# Patient Record
Sex: Male | Born: 1960
Health system: Southern US, Community
[De-identification: ages and names within clinical notes are randomized; demographics above are authoritative.]

## PROBLEM LIST (undated history)

## (undated) DIAGNOSIS — E291 Testicular hypofunction: Secondary | ICD-10-CM

## (undated) DIAGNOSIS — K219 Gastro-esophageal reflux disease without esophagitis: Secondary | ICD-10-CM

## (undated) DIAGNOSIS — F4321 Adjustment disorder with depressed mood: Secondary | ICD-10-CM

## (undated) DIAGNOSIS — Z87442 Personal history of urinary calculi: Secondary | ICD-10-CM

## (undated) DIAGNOSIS — M722 Plantar fascial fibromatosis: Secondary | ICD-10-CM

## (undated) DIAGNOSIS — N200 Calculus of kidney: Secondary | ICD-10-CM

## (undated) DIAGNOSIS — K4090 Unilateral inguinal hernia, without obstruction or gangrene, not specified as recurrent: Secondary | ICD-10-CM

## (undated) DIAGNOSIS — Z8619 Personal history of other infectious and parasitic diseases: Secondary | ICD-10-CM

## (undated) DIAGNOSIS — F32A Depression, unspecified: Secondary | ICD-10-CM

## (undated) DIAGNOSIS — N400 Enlarged prostate without lower urinary tract symptoms: Secondary | ICD-10-CM

## (undated) DIAGNOSIS — I10 Essential (primary) hypertension: Secondary | ICD-10-CM

## (undated) DIAGNOSIS — R3129 Other microscopic hematuria: Secondary | ICD-10-CM

## (undated) DIAGNOSIS — F5104 Psychophysiologic insomnia: Secondary | ICD-10-CM

## (undated) DIAGNOSIS — N529 Male erectile dysfunction, unspecified: Secondary | ICD-10-CM

## (undated) HISTORY — DX: Gastro-esophageal reflux disease without esophagitis: K21.9

## (undated) HISTORY — DX: Other microscopic hematuria: R31.29

## (undated) HISTORY — DX: Benign prostatic hyperplasia without lower urinary tract symptoms: N40.0

## (undated) HISTORY — DX: Unilateral inguinal hernia, without obstruction or gangrene, not specified as recurrent: K40.90

## (undated) HISTORY — DX: Calculus of kidney: N20.0

## (undated) HISTORY — PX: LITHOTRIPSY: SUR834

## (undated) HISTORY — DX: Testicular hypofunction: E29.1

## (undated) HISTORY — DX: Male erectile dysfunction, unspecified: N52.9

## (undated) NOTE — *Deleted (*Deleted)
12/19/2019 8:31 AM   Allen Chapman December 12, 1960 [id number]  Referring provider: [facility] [address] [place],  [place] [address] No chief complaint on file.   HPI: Allen Chapman is a 73 y.o. male who returns for a 1 month follow up of BPH and chronic prostatitis.   During his initial visit in 11/2018, the patient reported a long history of obstructive urinary symptoms including weak stream, hesitancy and nocturia.  He tried multiple medications in the past including finasteride which significantly impaired his libido.  He tried Flomax on several occasions for which he had dizziness and low blood pressure.  It appeared at some point he was prescribed doxazosin but the patient cannot recall whether or not this was tolerated.  He does have a personal history of bilateral pulmonary emboli diagnosed in 08/2018.  He was started on anticoagulation and had been referred to hematology for further evaluation of this. He was recommended to undergo indefinite anticoagulation on Xarelto who subsequently discontinued the medication.   Patient was seen in the ED on 11/08/2019 for left groin pain. Low back pain radiated to his groin. Pain was worse with walking. He had burning with urination. He was given Percocet for pain prn. Scrotum ultrasound on 11/09/2019 noted bilateral hydroceles and varicoceles. No palpable abnormality is noted within the testicles. Symmetrical tubular ectasia. CT renal stone study was negative.   Patient followed up with Carman Ching, PA-C on 11/14/2019 for prostatitis. He had lower abdominal and groin pain. Nocturia improved from 15 to 4 nightly. He also reported some bladder burning after termination of urination. He reported the pain was exacerbated with walking, during which times he felt that his left testicle swells.  He was taking alfuzosin daily but did not wish to continue it long-term because he felt it was decreasing his sex drive and he was  concerned about long-term sexual side effects.  He was taking Bactrim as prescribed.  He stopped meloxicam 2 days prior because he felt it was not helping his symptoms.  PVR 11 mL.   Patient was frustrated by his ongoing symptoms and curious as to why his prostate was inflamed, and wanted treatment to resolve this issue. Patient was given Myrbetriq 25 mg samples.  Patient reports that Myrbetriq 25 mg did/not improve his urinary symptoms. His nocturia is stable/better ***  His left testicle is ****  PMH: Past Medical History:  Diagnosis Date  . Erectile dysfunction 07/02/2014  . GERD (gastroesophageal reflux disease)   . Hematuria, microscopic 07/02/2014  . Hypogonadism in male 07/02/2014  . Inguinal hernia, left 07/02/2014  . Kidney stone   . Prostate enlargement     Surgical History: Past Surgical History:  Procedure Laterality Date  . APPENDECTOMY  1980  . CYSTOSCOPY  03/31/2014  . EXTRACORPOREAL SHOCK WAVE LITHOTRIPSY Left 10/10/2015   Procedure: EXTRACORPOREAL SHOCK WAVE LITHOTRIPSY (ESWL);  Surgeon: Hildred Laser, MD;  Location: [facility] ORS;  Service: Urology;  Laterality: Left;  . LITHOTRIPSY      Home Medications:  Allergies as of 12/19/2019      Reactions   Nortriptyline Shortness Of Breath   Flomax [tamsulosin Hcl] Other (See Comments)   Dizziness      Medication List       Accurate as of December 18, 2019  8:31 AM. If you have any questions, ask your nurse or doctor.        alfuzosin 10 MG 24 hr tablet Commonly known as: UROXATRAL Take 1 tablet (10 mg  total) by mouth daily with breakfast.   ciprofloxacin 500 MG tablet Commonly known as: CIPRO Take 500 mg by mouth 2 (two) times daily.   meloxicam 15 MG tablet Commonly known as: MOBIC Take 1 tablet (15 mg total) by mouth daily.   methylPREDNISolone 4 MG Tbpk tablet Commonly known as: MEDROL DOSEPAK 6 day dose pack - take as directed   mirabegron ER 25 MG Tb24 tablet Commonly known as: MYRBETRIQ  Take 1 tablet (25 mg total) by mouth daily.   naproxen 500 MG tablet Commonly known as: NAPROSYN Take 500 mg by mouth 2 (two) times daily.   omeprazole 40 MG capsule Commonly known as: PRILOSEC Take 40 mg by mouth daily.   oxyCODONE-acetaminophen 5-325 MG tablet Commonly known as: Percocet Take 1 tablet by mouth every 4 (four) hours as needed for severe pain.   predniSONE 10 MG tablet Commonly known as: DELTASONE Take 10 mg by mouth 2 (two) times daily.   sucralfate 1 g tablet Commonly known as: CARAFATE Take by mouth daily.   temazepam 15 MG capsule Commonly known as: RESTORIL Take 15 mg by mouth at bedtime.   traMADol 50 MG tablet Commonly known as: ULTRAM Take 50 mg by mouth every 8 (eight) hours as needed.   Xarelto 20 MG Tabs tablet Generic drug: rivaroxaban       Allergies:  Allergies  Allergen Reactions  . Nortriptyline Shortness Of Breath  . Flomax [Tamsulosin Hcl] Other (See Comments)    Dizziness    Family History: Family History  Problem Relation Age of Onset  . Bladder Cancer Neg Hx   . Kidney cancer Neg Hx   . Prostate cancer Neg Hx     Social History:  reports that he has never smoked. He has never used smokeless tobacco. He reports that he does not drink alcohol and does not use drugs.   Physical Exam: There were no vitals taken for this visit.  Constitutional:  Alert and oriented, No acute distress. HEENT: [place] AT, moist mucus membranes.  Trachea midline, no masses. Cardiovascular: No clubbing, cyanosis, or edema. Respiratory: Normal respiratory effort, no increased work of breathing. GI: Abdomen is soft, nontender, nondistended, no abdominal masses GU: No CVA tenderness Lymph: No cervical or inguinal lymphadenopathy. Skin: No rashes, bruises or suspicious lesions. Neurologic: Grossly intact, no focal deficits, moving all 4 extremities. Psychiatric: Normal mood and affect.  Laboratory Data:  Lab Results  Component Value Date    CREATININE 0.74 11/08/2019    No results found for: PSA  No results found for: TESTOSTERONE  No results found for: HGBA1C  Urinalysis   Pertinent Imaging: *** Results for orders placed during the hospital encounter of 11/22/15  Abdomen 1 view (KUB)  Narrative CLINICAL DATA:  Left UPJ calculus.  Pre lithotripsy exam.  EXAM: ABDOMEN - 1 VIEW  COMPARISON:  10/10/2015 and prior radiographs. 10/04/2015 and prior CT  FINDINGS: Bowel gas/stool obscures detail.  The left UPJ calculus identified on prior exams is difficult to visualize with certainty on this study.  No other suspicious calcifications are noted overlying the renal shadows or expected course of the ureters.  The bowel gas pattern is unremarkable.  No acute bony abnormalities identified.  IMPRESSION: Bowel gas/stool obscures detail within the previously identified left UPJ is not de fully visualize with certainty on this study. No other suspicious calcifications overlying the renal shadows or the expected courses of the ureters.   Electronically Signed By: Harmon Pier M.D. On: 11/22/2015 09:59  Results for orders placed during the hospital encounter of 05/23/17  US Venous Img Lower Bilateral  Narrative CLINICAL DATA:  Bilateral leg pain  EXAM: BILATERAL LOWER EXTREMITY VENOUS DOPPLER ULTRASOUND  TECHNIQUE: Gray-scale sonography with graded compression, as well as color Doppler and duplex ultrasound were performed to evaluate the lower extremity deep venous systems from the level of the common femoral vein and including the common femoral, femoral, profunda femoral, popliteal and calf veins including the posterior tibial, peroneal and gastrocnemius veins when visible. The superficial great saphenous vein was also interrogated. Spectral Doppler was utilized to evaluate flow at rest and with distal augmentation maneuvers in the common femoral, femoral and popliteal veins.  COMPARISON:  None.   FINDINGS: RIGHT LOWER EXTREMITY  Common Femoral Vein: No evidence of thrombus. Normal compressibility, respiratory phasicity and response to augmentation.  Saphenofemoral Junction: No evidence of thrombus. Normal compressibility and flow on color Doppler imaging.  Profunda Femoral Vein: No evidence of thrombus. Normal compressibility and flow on color Doppler imaging.  Femoral Vein: No evidence of thrombus. Normal compressibility, respiratory phasicity and response to augmentation.  Popliteal Vein: No evidence of thrombus. Normal compressibility, respiratory phasicity and response to augmentation.  Calf Veins: No evidence of thrombus. Normal compressibility and flow on color Doppler imaging.  Superficial Great Saphenous Vein: No evidence of thrombus. Normal compressibility.  Venous Reflux:  None.  Other Findings:  None.  LEFT LOWER EXTREMITY  Common Femoral Vein: No evidence of thrombus. Normal compressibility, respiratory phasicity and response to augmentation.  Saphenofemoral Junction: No evidence of thrombus. Normal compressibility and flow on color Doppler imaging.  Profunda Femoral Vein: No evidence of thrombus. Normal compressibility and flow on color Doppler imaging.  Femoral Vein: No evidence of thrombus. Normal compressibility, respiratory phasicity and response to augmentation.  Popliteal Vein: No evidence of thrombus. Normal compressibility, respiratory phasicity and response to augmentation.  Calf Veins: No evidence of thrombus. Normal compressibility and flow on color Doppler imaging.  Superficial Great Saphenous Vein: No evidence of thrombus. Normal compressibility.  Venous Reflux:  None.  Other Findings:  None.  IMPRESSION: No evidence of deep venous thrombosis bilaterally.   Electronically Signed By: Alcide Clever M.D. On: 05/23/2017 16:08  No results found for this or any previous visit.  No results found for this or any previous  visit.  No results found for this or any previous visit.  No results found for this or any previous visit.  No results found for this or any previous visit.  Results for orders placed during the hospital encounter of 11/08/19  CT Renal Stone Study  Narrative CLINICAL DATA:  Flank pain, dysuria, left groin pain  EXAM: CT ABDOMEN AND PELVIS WITHOUT CONTRAST  TECHNIQUE: Multidetector CT imaging of the abdomen and pelvis was performed following the standard protocol without IV contrast.  COMPARISON:  10/07/2018  FINDINGS: Lower chest: The visualized lung bases are clear. The visualized heart and pericardium are unremarkable.  Hepatobiliary: No focal liver abnormality is seen. No gallstones, gallbladder wall thickening, or biliary dilatation.  Pancreas: Are unremarkable  Spleen: Unremarkable  Adrenals/Urinary Tract: The adrenal glands are unremarkable. The kidneys are normal in size and position. Multiple bilateral simple cortical cysts are identified measuring up to 8.1 cm within the lower pole of the right kidney and 4.2 cm with within the lower pole of the left kidney. Hyperdense renal cyst arising from the lower pole of the right kidney is decreased in size since remote prior examination of 08/28/2013, safely considered benign.  No intrarenal or ureteral calculi. No hydronephrosis. The bladder is largely decompressed and is unremarkable.  Stomach/Bowel: The stomach, small bowel, and large bowel are unremarkable. Appendix normal. No free intraperitoneal gas or fluid.  Vascular/Lymphatic: The abdominal vasculature is unremarkable on this noncontrast examination. No pathologic adenopathy within the abdomen and pelvis.  Reproductive: Prostate is unremarkable.  Other: Rectum unremarkable  Musculoskeletal: Bone island within the left greater trochanter. Degenerative changes are seen within the lumbosacral junction. No lytic or blastic bone lesions are seen.   IMPRESSION: No radiographic explanation for the patient's symptoms. Incidental findings as noted above.   Electronically Signed By: Helyn Numbers MD On: 11/09/2019 02:34   Assessment & Plan:     No follow-ups on file.  [facility] [address] [place], [place] [address] [phone]  I, Theador Hawthorne, am acting as a scribe for Dr. Vanna Scotland.  {Add Production assistant, radio Statement}

---
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
## 2008-12-06 ENCOUNTER — Emergency Department: Payer: Self-pay | Admitting: Emergency Medicine

## 2009-03-31 ENCOUNTER — Emergency Department: Payer: Self-pay | Admitting: Emergency Medicine

## 2010-10-02 ENCOUNTER — Ambulatory Visit: Payer: Self-pay | Admitting: Family Medicine

## 2011-01-23 ENCOUNTER — Ambulatory Visit: Payer: Self-pay | Admitting: Rheumatology

## 2012-01-10 ENCOUNTER — Ambulatory Visit: Payer: Self-pay | Admitting: Internal Medicine

## 2012-01-14 ENCOUNTER — Emergency Department: Payer: Self-pay | Admitting: Emergency Medicine

## 2012-01-14 LAB — BASIC METABOLIC PANEL
Anion Gap: 5 — ABNORMAL LOW (ref 7–16)
BUN: 18 mg/dL (ref 7–18)
Calcium, Total: 9.2 mg/dL (ref 8.5–10.1)
Chloride: 105 mmol/L (ref 98–107)
Co2: 30 mmol/L (ref 21–32)
Creatinine: 0.77 mg/dL (ref 0.60–1.30)
EGFR (African American): >60
EGFR (Non-African Amer.): >60
Glucose: 95 mg/dL (ref 65–99)
Osmolality: 281 (ref 275–301)
Potassium: 4 mmol/L (ref 3.5–5.1)
Sodium: 140 mmol/L (ref 136–145)

## 2012-01-14 LAB — URINALYSIS, COMPLETE
Bilirubin,UR: NEGATIVE
Blood: NEGATIVE
Glucose,UR: NEGATIVE mg/dL (ref 0–75)
Ketone: NEGATIVE
Leukocyte Esterase: NEGATIVE
Nitrite: NEGATIVE
Ph: 6 (ref 4.5–8.0)
Protein: NEGATIVE
RBC,UR: NONE SEEN /HPF (ref 0–5)
Specific Gravity: 1.018 (ref 1.003–1.030)
Squamous Epithelial: <1
WBC UR: 1 /HPF (ref 0–5)

## 2012-01-14 LAB — CBC
HCT: 44.6 % (ref 40.0–52.0)
HGB: 15.6 g/dL (ref 13.0–18.0)
MCH: 28.9 pg (ref 26.0–34.0)
MCHC: 35 g/dL (ref 32.0–36.0)
MCV: 82 fL (ref 80–100)
Platelet: 237 10*3/uL (ref 150–440)
RBC: 5.41 10*6/uL (ref 4.40–5.90)
RDW: 14 % (ref 11.5–14.5)
WBC: 6.8 10*3/uL (ref 3.8–10.6)

## 2012-01-15 LAB — URINE CULTURE

## 2012-07-01 DIAGNOSIS — R350 Frequency of micturition: Secondary | ICD-10-CM | POA: Insufficient documentation

## 2012-07-01 DIAGNOSIS — R3916 Straining to void: Secondary | ICD-10-CM | POA: Insufficient documentation

## 2012-07-01 DIAGNOSIS — R3915 Urgency of urination: Secondary | ICD-10-CM | POA: Insufficient documentation

## 2012-07-01 DIAGNOSIS — R351 Nocturia: Secondary | ICD-10-CM | POA: Insufficient documentation

## 2013-03-22 ENCOUNTER — Ambulatory Visit: Payer: Self-pay | Admitting: Podiatry

## 2013-04-11 ENCOUNTER — Ambulatory Visit (INDEPENDENT_AMBULATORY_CARE_PROVIDER_SITE_OTHER): Payer: BC Managed Care – PPO

## 2013-04-11 ENCOUNTER — Ambulatory Visit: Payer: Self-pay | Admitting: Podiatry

## 2013-04-11 ENCOUNTER — Encounter: Payer: Self-pay | Admitting: Podiatry

## 2013-04-11 ENCOUNTER — Ambulatory Visit (INDEPENDENT_AMBULATORY_CARE_PROVIDER_SITE_OTHER): Payer: BC Managed Care – PPO | Admitting: Podiatry

## 2013-04-11 VITALS — BP 139/94 | HR 92 | Resp 16 | Ht 66.0 in | Wt 172.0 lb

## 2013-04-11 DIAGNOSIS — M722 Plantar fascial fibromatosis: Secondary | ICD-10-CM

## 2013-04-11 MED ORDER — DICLOFENAC SODIUM 75 MG PO TBEC: 75.0000 mg | DELAYED_RELEASE_TABLET | Freq: Two times a day (BID) | ORAL | Status: DC

## 2013-04-11 MED ORDER — TRIAMCINOLONE ACETONIDE 10 MG/ML IJ SUSP
10.0000 mg | Freq: Once | INTRAMUSCULAR | Status: AC
  Administered 2013-04-11: 10 mg

## 2013-04-11 NOTE — Progress Notes (Signed)
Subjective:     Patient ID: Allen Chapman, male   DOB: 12/31/1960, 53 y.o.   MRN: [id number]  Foot Pain   patient presents stating I'm having a lot of pain in my left heel that it's been present for about one year. I've had several injections which helped me temporarily but no other treatment besides medicine by mouth which only helped a little bit   Review of Systems  All other systems reviewed and are negative.       Objective:   Physical Exam  Nursing note and vitals reviewed. Constitutional: He is oriented to person, place, and time.  Cardiovascular: Intact distal pulses.   Musculoskeletal: Normal range of motion.  Neurological: He is oriented to person, place, and time.  Skin: Skin is warm.   neurovascular status intact with muscle strength adequate and no equinus condition noted and range of motion within normal limits. Discomfort noted in the left plantar heel and also in the left lateral foot around the peroneal tendon complex as it inserts into the base of the fifth metatarsal bone    Assessment:     Plantar fasciitis of a significant nature left with also tendinitis lateral that is compensation in nature    Plan:     H&P and x-ray reviewed. Injected the plantar fascia 3 mg Kenalog 5 mg Xylocaine Marcaine mixture and the left lateral foot 3 mg Kenalog 5 mg Xylocaine Marcaine mixture and dispensed night splint and fascial brace to wear. Begin voltaren and 75 mg twice a day and reappoint in 2 weeks

## 2013-04-11 NOTE — Patient Instructions (Signed)

## 2013-04-11 NOTE — Progress Notes (Signed)
   Subjective:    Patient ID: Allen Chapman, male    DOB: 08/08/1960, 53 y.o.   MRN: [id number]  HPI Comments: Been complaining a lot about heel pain in the left foot, feels like a bruise, especially when walking , its been ober a year . Taken etodolac for it. Seen dr Ether Griffinsfowler [facility] at [facility] , he had two injections   Foot Pain      Review of Systems  All other systems reviewed and are negative.       Objective:   Physical Exam        Assessment & Plan:

## 2013-04-25 ENCOUNTER — Ambulatory Visit: Payer: BC Managed Care – PPO | Admitting: Podiatry

## 2013-05-02 ENCOUNTER — Ambulatory Visit (INDEPENDENT_AMBULATORY_CARE_PROVIDER_SITE_OTHER): Payer: BC Managed Care – PPO | Admitting: Podiatry

## 2013-05-02 VITALS — BP 132/90 | HR 91 | Resp 16 | Ht 66.0 in | Wt 169.0 lb

## 2013-05-02 DIAGNOSIS — M722 Plantar fascial fibromatosis: Secondary | ICD-10-CM

## 2013-05-02 MED ORDER — TRIAMCINOLONE ACETONIDE 10 MG/ML IJ SUSP
10.0000 mg | Freq: Once | INTRAMUSCULAR | Status: AC
  Administered 2013-05-02: 10 mg

## 2013-05-02 MED ORDER — TRAMADOL HCL 50 MG PO TABS: 50.0000 mg | ORAL_TABLET | Freq: Three times a day (TID) | ORAL | Status: DC

## 2013-05-04 NOTE — Progress Notes (Signed)
Subjective:     Patient ID: Carmie KannerRafael Bord, male   DOB: 09/06/1960, 53 y.o.   MRN: [id number]  HPI patient presents with interpreter stating my left heel is still extremely sore and I did not do better with the medication that U. gave me at last visit. Patient presents with interpreter and states that he has to walk on cement floor   Review of Systems     Objective:   Physical Exam Neurovascular status intact with intense discomfort left plantar heel at the insertion of the tendon into the calcaneus with inflammation and fluid noted around the medial band    Assessment:     Plantar fasciitis with acute inflammation left heel that did not respond well to conservative care to this time    Plan:     Reviewed condition with patient and interpreter. I have recommended aggressive conservative approach with possibility that this will require surgery. Today I did reinject the plantar fascia 3 mg Kenalog 5 mg Xylocaine Marcaine mixture placed on tramadol 50 mg 3 times a day dispensed air fracture walker to completely immobilize the plantar heel and scanned for custom orthotics to reduce stress against the arch and heel. If we cannot achieve success this will require a more aggressive treatment options

## 2013-05-17 ENCOUNTER — Encounter: Payer: Self-pay | Admitting: *Deleted

## 2013-05-17 NOTE — Progress Notes (Signed)
Sent pt post card letting him know orthotics are here. 

## 2013-05-23 ENCOUNTER — Ambulatory Visit: Payer: BC Managed Care – PPO | Admitting: Podiatry

## 2013-06-13 ENCOUNTER — Ambulatory Visit (INDEPENDENT_AMBULATORY_CARE_PROVIDER_SITE_OTHER): Payer: BC Managed Care – PPO | Admitting: Podiatry

## 2013-06-13 VITALS — Resp 16 | Ht 66.0 in | Wt 169.0 lb

## 2013-06-13 DIAGNOSIS — M722 Plantar fascial fibromatosis: Secondary | ICD-10-CM

## 2013-06-13 NOTE — Progress Notes (Signed)
Subjective:     Patient ID: Allen KannerRafael Rabanal, male   DOB: 03/22/1960, 53 y.o.   MRN: [id number]  HPI patient presents with daughter stating my heel and arch are still killing me and the injections are not helping   Review of Systems     Objective:   Physical Exam Neurovascular status intact with continued discomfort in the left plantar heel but intense discomfort in the mid arch area left where he is probably walking differently    Assessment:     Plantar fasciitis of the mid arch area and heel left    Plan:     Orthotics dispensed with instructions and today I dispensed air fracture walker with all instructions on usage to try to take all pressure off his foot with the possibility this will require surgery long-term

## 2013-07-04 ENCOUNTER — Ambulatory Visit: Payer: BC Managed Care – PPO | Admitting: Podiatry

## 2013-07-30 ENCOUNTER — Emergency Department: Payer: Self-pay | Admitting: Emergency Medicine

## 2013-07-30 LAB — CBC WITH DIFFERENTIAL/PLATELET
Basophil #: 0 10*3/uL (ref 0.0–0.1)
Basophil %: 0.5 %
Eosinophil #: 0.2 10*3/uL (ref 0.0–0.7)
Eosinophil %: 2.5 %
HCT: 44.7 % (ref 40.0–52.0)
HGB: 15.2 g/dL (ref 13.0–18.0)
Lymphocyte #: 3.3 10*3/uL (ref 1.0–3.6)
Lymphocyte %: 40.2 %
MCH: 28.6 pg (ref 26.0–34.0)
MCHC: 34 g/dL (ref 32.0–36.0)
MCV: 84 fL (ref 80–100)
Monocyte #: 0.5 x10 3/mm (ref 0.2–1.0)
Monocyte %: 6.5 %
Neutrophil #: 4.2 10*3/uL (ref 1.4–6.5)
Neutrophil %: 50.3 %
Platelet: 225 10*3/uL (ref 150–440)
RBC: 5.32 10*6/uL (ref 4.40–5.90)
RDW: 13.8 % (ref 11.5–14.5)
WBC: 8.3 10*3/uL (ref 3.8–10.6)

## 2013-07-30 LAB — BASIC METABOLIC PANEL
Anion Gap: 6 — ABNORMAL LOW (ref 7–16)
BUN: 17 mg/dL (ref 7–18)
Calcium, Total: 8.2 mg/dL — ABNORMAL LOW (ref 8.5–10.1)
Chloride: 106 mmol/L (ref 98–107)
Co2: 29 mmol/L (ref 21–32)
Creatinine: 0.92 mg/dL (ref 0.60–1.30)
EGFR (African American): >60
EGFR (Non-African Amer.): >60
Glucose: 92 mg/dL (ref 65–99)
Osmolality: 282 (ref 275–301)
Potassium: 3.1 mmol/L — ABNORMAL LOW (ref 3.5–5.1)
Sodium: 141 mmol/L (ref 136–145)

## 2013-08-25 ENCOUNTER — Emergency Department: Payer: Self-pay | Admitting: Emergency Medicine

## 2013-08-25 LAB — COMPREHENSIVE METABOLIC PANEL
Albumin: 3.3 g/dL — ABNORMAL LOW (ref 3.4–5.0)
Alkaline Phosphatase: 62 U/L
Anion Gap: 7 (ref 7–16)
BUN: 21 mg/dL — ABNORMAL HIGH (ref 7–18)
Bilirubin,Total: 0.6 mg/dL (ref 0.2–1.0)
Calcium, Total: 8.6 mg/dL (ref 8.5–10.1)
Chloride: 110 mmol/L — ABNORMAL HIGH (ref 98–107)
Co2: 25 mmol/L (ref 21–32)
Creatinine: 0.79 mg/dL (ref 0.60–1.30)
EGFR (African American): >60
EGFR (Non-African Amer.): >60
Glucose: 131 mg/dL — ABNORMAL HIGH (ref 65–99)
Osmolality: 288 (ref 275–301)
Potassium: 3.8 mmol/L (ref 3.5–5.1)
SGOT(AST): 24 U/L (ref 15–37)
SGPT (ALT): 33 U/L (ref 12–78)
Sodium: 142 mmol/L (ref 136–145)
Total Protein: 7 g/dL (ref 6.4–8.2)

## 2013-08-25 LAB — TROPONIN I: Troponin-I: <0.02 ng/mL

## 2013-08-25 LAB — URINALYSIS, COMPLETE
Bacteria: NONE SEEN
Bilirubin,UR: NEGATIVE
Blood: NEGATIVE
Glucose,UR: NEGATIVE mg/dL (ref 0–75)
Ketone: NEGATIVE
Leukocyte Esterase: NEGATIVE
Nitrite: NEGATIVE
Ph: 5 (ref 4.5–8.0)
Protein: NEGATIVE
RBC,UR: <1 /HPF (ref 0–5)
Specific Gravity: 1.027 (ref 1.003–1.030)
Squamous Epithelial: NONE SEEN
WBC UR: 1 /HPF (ref 0–5)

## 2013-08-25 LAB — CBC WITH DIFFERENTIAL/PLATELET
Basophil #: 0.1 10*3/uL (ref 0.0–0.1)
Basophil %: 1.5 %
Eosinophil #: 0.1 10*3/uL (ref 0.0–0.7)
Eosinophil %: 2.9 %
HCT: 44.1 % (ref 40.0–52.0)
HGB: 14.8 g/dL (ref 13.0–18.0)
Lymphocyte #: 2 10*3/uL (ref 1.0–3.6)
Lymphocyte %: 44.1 %
MCH: 28.2 pg (ref 26.0–34.0)
MCHC: 33.5 g/dL (ref 32.0–36.0)
MCV: 84 fL (ref 80–100)
Monocyte #: 0.4 x10 3/mm (ref 0.2–1.0)
Monocyte %: 9.1 %
Neutrophil #: 1.9 10*3/uL (ref 1.4–6.5)
Neutrophil %: 42.4 %
Platelet: 228 10*3/uL (ref 150–440)
RBC: 5.24 10*6/uL (ref 4.40–5.90)
RDW: 13.6 % (ref 11.5–14.5)
WBC: 4.5 10*3/uL (ref 3.8–10.6)

## 2013-08-25 LAB — LIPASE, BLOOD: Lipase: 164 U/L (ref 73–393)

## 2013-08-28 ENCOUNTER — Emergency Department: Payer: Self-pay | Admitting: Emergency Medicine

## 2013-08-28 LAB — COMPREHENSIVE METABOLIC PANEL
Albumin: 3.2 g/dL — ABNORMAL LOW (ref 3.4–5.0)
Alkaline Phosphatase: 65 U/L
Anion Gap: 7 (ref 7–16)
BUN: 10 mg/dL (ref 7–18)
Bilirubin,Total: 0.5 mg/dL (ref 0.2–1.0)
Calcium, Total: 8.4 mg/dL — ABNORMAL LOW (ref 8.5–10.1)
Chloride: 106 mmol/L (ref 98–107)
Co2: 27 mmol/L (ref 21–32)
Creatinine: 0.88 mg/dL (ref 0.60–1.30)
EGFR (African American): >60
EGFR (Non-African Amer.): >60
Glucose: 106 mg/dL — ABNORMAL HIGH (ref 65–99)
Osmolality: 279 (ref 275–301)
Potassium: 3.7 mmol/L (ref 3.5–5.1)
SGOT(AST): 42 U/L — ABNORMAL HIGH (ref 15–37)
SGPT (ALT): 44 U/L (ref 12–78)
Sodium: 140 mmol/L (ref 136–145)
Total Protein: 7 g/dL (ref 6.4–8.2)

## 2013-08-28 LAB — CBC
HCT: 42.8 % (ref 40.0–52.0)
HGB: 14.4 g/dL (ref 13.0–18.0)
MCH: 28.4 pg (ref 26.0–34.0)
MCHC: 33.6 g/dL (ref 32.0–36.0)
MCV: 85 fL (ref 80–100)
Platelet: 221 10*3/uL (ref 150–440)
RBC: 5.06 10*6/uL (ref 4.40–5.90)
RDW: 13.3 % (ref 11.5–14.5)
WBC: 5.1 10*3/uL (ref 3.8–10.6)

## 2013-08-28 LAB — URINALYSIS, COMPLETE
Bacteria: NONE SEEN
Bilirubin,UR: NEGATIVE
Blood: NEGATIVE
Glucose,UR: NEGATIVE mg/dL (ref 0–75)
Ketone: NEGATIVE
Leukocyte Esterase: NEGATIVE
Nitrite: NEGATIVE
Ph: 8 (ref 4.5–8.0)
Protein: NEGATIVE
RBC,UR: <1 /HPF (ref 0–5)
Specific Gravity: 1.009 (ref 1.003–1.030)
Squamous Epithelial: <1
WBC UR: <1 /HPF (ref 0–5)

## 2013-08-28 LAB — LIPASE, BLOOD: Lipase: 132 U/L (ref 73–393)

## 2013-10-17 ENCOUNTER — Ambulatory Visit (INDEPENDENT_AMBULATORY_CARE_PROVIDER_SITE_OTHER): Payer: BC Managed Care – PPO | Admitting: Podiatry

## 2013-10-17 VITALS — BP 135/87 | HR 89 | Resp 16

## 2013-10-17 DIAGNOSIS — M7750 Other enthesopathy of unspecified foot: Secondary | ICD-10-CM

## 2013-10-17 DIAGNOSIS — M722 Plantar fascial fibromatosis: Secondary | ICD-10-CM

## 2013-10-17 MED ORDER — TRIAMCINOLONE ACETONIDE 10 MG/ML IJ SUSP
10.0000 mg | Freq: Once | INTRAMUSCULAR | Status: AC
  Administered 2013-10-17: 10 mg

## 2013-10-17 NOTE — Progress Notes (Signed)
Subjective:     Patient ID: Allen Chapman, male   DOB: Oct 05, 1960, 53 y.o.   MRN: [id number]  HPI patient presents with wife stating that his left plantar heel continues to give him significant problems and he is now developing a pain in the tendon in the outside of his left foot secondary to walking differently. They state he is wearing the boot when not working but it is not helping to make him better   Review of Systems     Objective:   Physical Exam Neurovascular status intact with intense discomfort plantar aspect left heel and pain in the outside of the left foot at the peroneal insertion into the base of the fifth metatarsal    Assessment:     Continued plantar fasciitis left which is failed to respond and tendinitis of the peroneal insertion secondary to gait change    Plan:     Spent a great deal of time discussing both problems and at this point do to failure to improve with numerous conservative treatments recommended that we fixed the left heel with endoscopic surgery. Spent a great deal of time going over that there is no guarantee that this will solve the problem and that actually she may have a worsening of the problem or arch pain or other problems which can occur. I did inject the left peroneal insertion today 3 mg Kenalog 5 mg Xylocaine to try to help reduce the inflammation and I then had patient and caregiver signed consent form after extensive review understanding that total recovery. Can take upwards of 6 months for this problem. Patient will schedule for endoscopic surgery left heel in about one month when his wife recovers from surgery but she is having next week

## 2013-10-25 ENCOUNTER — Telehealth: Payer: Self-pay

## 2013-10-25 NOTE — Telephone Encounter (Signed)
Attempted to call pt regarding post operative status

## 2013-11-02 ENCOUNTER — Telehealth: Payer: Self-pay | Admitting: *Deleted

## 2013-11-02 NOTE — Telephone Encounter (Signed)
Patient in a lot of pain needs something for pain

## 2013-11-03 ENCOUNTER — Other Ambulatory Visit: Payer: Self-pay | Admitting: *Deleted

## 2013-11-03 MED ORDER — HYDROCODONE-ACETAMINOPHEN 10-325 MG PO TABS: 1.0000 | ORAL_TABLET | Freq: Three times a day (TID) | ORAL | Status: DC | PRN

## 2013-11-03 NOTE — Telephone Encounter (Signed)
Ok per dr regal  Pain medication. vicodin 10/325 left message for patient to pick up at the front desk

## 2013-11-24 ENCOUNTER — Encounter: Payer: Self-pay | Admitting: *Deleted

## 2013-11-24 NOTE — Progress Notes (Signed)
Aram BeechamCynthia from surgery center called stated pt needed to cancel surgery due to his wife having surgery. Will call back later to r/s.

## 2013-12-08 ENCOUNTER — Encounter: Payer: BC Managed Care – PPO | Admitting: Podiatry

## 2013-12-20 ENCOUNTER — Ambulatory Visit: Payer: Self-pay | Admitting: Urology

## 2013-12-27 ENCOUNTER — Ambulatory Visit: Payer: BC Managed Care – PPO | Admitting: General Surgery

## 2014-01-04 ENCOUNTER — Ambulatory Visit (INDEPENDENT_AMBULATORY_CARE_PROVIDER_SITE_OTHER): Payer: BC Managed Care – PPO | Admitting: General Surgery

## 2014-01-04 ENCOUNTER — Encounter: Payer: Self-pay | Admitting: General Surgery

## 2014-01-04 VITALS — BP 110/68 | HR 78 | Resp 14 | Ht 66.0 in | Wt 168.0 lb

## 2014-01-04 DIAGNOSIS — R1032 Left lower quadrant pain: Secondary | ICD-10-CM

## 2014-01-04 DIAGNOSIS — R1030 Lower abdominal pain, unspecified: Secondary | ICD-10-CM

## 2014-01-04 NOTE — Progress Notes (Signed)
Patient ID: Allen Chapman, male   DOB: 12/01/1960, 53 y.o.   MRN: [id number]  Chief Complaint  Patient presents with  . Other    evaluation of inguinal hernia    HPI Allen Chapman is a 53 y.o. male who presents for an evaluation of a left inguinal hernia. He states he doesn't notice any abnormality in the area. He has had pain for approximately 4 months ago. No previous hernias. He uses tylenol to help relieve the pain. The pain is located between the leg and his testicle. The pain radiates down his left leg. He has some tingling in his leg as well with walking. The pain is described as an ache.   HPI  Past Medical History  Diagnosis Date  . GERD (gastroesophageal reflux disease)   . Prostate enlargement   . Kidney stone     Past Surgical History  Procedure Laterality Date  . Appendectomy  1980  . Lithotripsy      History reviewed. No pertinent family history.  Social History History  Substance Use Topics  . Smoking status: Never Smoker   . Smokeless tobacco: Never Used  . Alcohol Use: No    No Known Allergies  Current Outpatient Prescriptions  Medication Sig Dispense Refill  . omeprazole (PRILOSEC) 40 MG capsule Take 40 mg by mouth daily.    Marland Kitchen. oxyCODONE-acetaminophen (PERCOCET/ROXICET) 5-325 MG per tablet Take 1 tablet by mouth every 6 (six) hours as needed for severe pain.     No current facility-administered medications for this visit.    Review of Systems Review of Systems  Constitutional: Negative.   Respiratory: Negative.   Cardiovascular: Negative.     Blood pressure 110/68, pulse 78, resp. rate 14, height 5\' 6"  (1.676 m), weight 168 lb (76.204 kg).  Physical Exam Physical Exam  Constitutional: He is oriented to person, place, and time. He appears well-developed and well-nourished.  Cardiovascular: Normal rate and regular rhythm.   No murmur heard. Pulmonary/Chest: Effort normal and breath sounds normal.  Abdominal: Soft. Normal appearance and bowel  sounds are normal. There is no hepatosplenomegaly. There is tenderness (over the left edge of pubic bone). No hernia.  Lymphadenopathy:       Right: No inguinal adenopathy present.       Left: No inguinal adenopathy present.  Neurological: He is alert and oriented to person, place, and time.  Skin: Skin is warm and dry.    Data Reviewed CT of the abdomen/pelvis from July and most recent. No hernias seen.   Assessment    Left groin pain. No hernia noted. Patient advised.      Plan    Patient to return as needed.     Interpreter present for duration of visit. Loyda.   Falen Lehrmann G 01/05/2014, 6:39 AM

## 2014-01-04 NOTE — Patient Instructions (Signed)
Patient to return as needed. He was advised he can use Aleve or Ibuprofen for pain relief. The patient is aware to call back for any questions or concerns.

## 2014-01-05 ENCOUNTER — Encounter: Payer: Self-pay | Admitting: General Surgery

## 2014-01-08 ENCOUNTER — Ambulatory Visit: Payer: BC Managed Care – PPO | Admitting: General Surgery

## 2014-01-17 DIAGNOSIS — R197 Diarrhea, unspecified: Secondary | ICD-10-CM | POA: Insufficient documentation

## 2014-03-29 ENCOUNTER — Ambulatory Visit: Payer: Self-pay | Admitting: Family Medicine

## 2014-03-31 HISTORY — PX: CYSTOSCOPY: SUR368

## 2014-05-19 DIAGNOSIS — N419 Inflammatory disease of prostate, unspecified: Secondary | ICD-10-CM | POA: Insufficient documentation

## 2014-05-19 DIAGNOSIS — R3129 Other microscopic hematuria: Secondary | ICD-10-CM | POA: Insufficient documentation

## 2014-05-19 DIAGNOSIS — N200 Calculus of kidney: Secondary | ICD-10-CM | POA: Insufficient documentation

## 2014-05-19 DIAGNOSIS — N400 Enlarged prostate without lower urinary tract symptoms: Secondary | ICD-10-CM | POA: Insufficient documentation

## 2014-05-19 DIAGNOSIS — N529 Male erectile dysfunction, unspecified: Secondary | ICD-10-CM | POA: Insufficient documentation

## 2014-05-19 DIAGNOSIS — E291 Testicular hypofunction: Secondary | ICD-10-CM | POA: Insufficient documentation

## 2014-05-19 DIAGNOSIS — R300 Dysuria: Secondary | ICD-10-CM | POA: Insufficient documentation

## 2014-05-19 DIAGNOSIS — K4090 Unilateral inguinal hernia, without obstruction or gangrene, not specified as recurrent: Secondary | ICD-10-CM | POA: Insufficient documentation

## 2014-07-02 ENCOUNTER — Encounter: Payer: Self-pay | Admitting: Urology

## 2014-07-02 DIAGNOSIS — E291 Testicular hypofunction: Secondary | ICD-10-CM | POA: Insufficient documentation

## 2014-07-02 DIAGNOSIS — N529 Male erectile dysfunction, unspecified: Secondary | ICD-10-CM

## 2014-07-02 DIAGNOSIS — R3129 Other microscopic hematuria: Secondary | ICD-10-CM

## 2014-07-02 DIAGNOSIS — K4090 Unilateral inguinal hernia, without obstruction or gangrene, not specified as recurrent: Secondary | ICD-10-CM

## 2014-07-02 HISTORY — DX: Testicular hypofunction: E29.1

## 2014-07-02 HISTORY — DX: Male erectile dysfunction, unspecified: N52.9

## 2014-07-02 HISTORY — DX: Unilateral inguinal hernia, without obstruction or gangrene, not specified as recurrent: K40.90

## 2014-07-02 HISTORY — DX: Other microscopic hematuria: R31.29

## 2014-07-18 ENCOUNTER — Ambulatory Visit: Payer: Self-pay | Admitting: Urology

## 2014-08-06 ENCOUNTER — Encounter: Payer: Self-pay | Admitting: Urology

## 2014-08-06 ENCOUNTER — Ambulatory Visit: Payer: Self-pay | Admitting: Urology

## 2014-09-12 ENCOUNTER — Ambulatory Visit: Payer: BLUE CROSS/BLUE SHIELD | Admitting: Podiatry

## 2014-10-18 ENCOUNTER — Other Ambulatory Visit: Payer: Self-pay | Admitting: Podiatry

## 2014-10-18 DIAGNOSIS — M7672 Peroneal tendinitis, left leg: Secondary | ICD-10-CM

## 2014-10-24 ENCOUNTER — Telehealth: Payer: Self-pay | Admitting: Radiology

## 2014-10-25 ENCOUNTER — Ambulatory Visit: Admission: RE | Admit: 2014-10-25 | Payer: BLUE CROSS/BLUE SHIELD | Source: Ambulatory Visit

## 2014-10-25 ENCOUNTER — Ambulatory Visit
Admission: RE | Admit: 2014-10-25 | Discharge: 2014-10-25 | Disposition: A | Discharge status: Home / Self Care | Payer: BLUE CROSS/BLUE SHIELD | Source: Ambulatory Visit | Attending: Podiatry | Admitting: Podiatry

## 2014-10-25 ENCOUNTER — Encounter: Payer: Self-pay | Admitting: Podiatry

## 2014-10-25 DIAGNOSIS — M7672 Peroneal tendinitis, left leg: Secondary | ICD-10-CM | POA: Insufficient documentation

## 2014-10-31 DIAGNOSIS — F5104 Psychophysiologic insomnia: Secondary | ICD-10-CM | POA: Insufficient documentation

## 2014-10-31 DIAGNOSIS — IMO0001 Reserved for inherently not codable concepts without codable children: Secondary | ICD-10-CM | POA: Insufficient documentation

## 2014-10-31 DIAGNOSIS — K219 Gastro-esophageal reflux disease without esophagitis: Secondary | ICD-10-CM | POA: Insufficient documentation

## 2014-10-31 DIAGNOSIS — R030 Elevated blood-pressure reading, without diagnosis of hypertension: Secondary | ICD-10-CM

## 2015-01-28 ENCOUNTER — Ambulatory Visit: Payer: Self-pay

## 2015-01-28 ENCOUNTER — Encounter: Payer: Self-pay | Admitting: Podiatry

## 2015-01-28 ENCOUNTER — Ambulatory Visit (INDEPENDENT_AMBULATORY_CARE_PROVIDER_SITE_OTHER): Payer: BLUE CROSS/BLUE SHIELD | Admitting: Podiatry

## 2015-01-28 VITALS — BP 131/90 | HR 89 | Resp 12

## 2015-01-28 DIAGNOSIS — M722 Plantar fascial fibromatosis: Secondary | ICD-10-CM

## 2015-01-28 DIAGNOSIS — M7672 Peroneal tendinitis, left leg: Secondary | ICD-10-CM

## 2015-01-28 MED ORDER — MELOXICAM 15 MG PO TABS: 15.0000 mg | ORAL_TABLET | Freq: Every day | ORAL | Status: DC

## 2015-01-28 MED ORDER — METHYLPREDNISOLONE 4 MG PO TBPK: ORAL_TABLET | ORAL | Status: DC

## 2015-01-28 NOTE — Progress Notes (Signed)
He presents today for a chief complaint of pain to the lateral aspect of the left foot and heel 2 years. He is also complaining of right heel pain for at least a year. States that he has seen several doctors who performed injections suggested surgery and who have performed an MRI. He states that his MRI was performed by [facility] 3 months ago. He states the doctor told him that he was fine and there was nothing that could be done. He's tried pain medication but states that it does not seem to make a difference.  Objective: Vital signs stable alert and oriented 3. Pulses are strongly palpable. Neurologic sensorium is intact. Deep tendon reflexes are intact bilateral and muscle strength is 5 over 5 dorsiflexion plantar flexors and inverters everters all digits of musculature is intact. Orthopedic evaluation demonstrates pain on palpation medial calcaneal tubercle of the right heel. He also has pain on palpation medial calcaneal tubercle of the left heel but worse pain on palpation of the peroneal tendons of the left heel. Particularly as they coursed beneath the lateral malleolus extending proximally. Radius taken today demonstrate soft tissue increase in density at the plantar fascial calcaneal insertion site. I was unable to review his MRI. He will bring it next visit.  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  Plan: We discussed etiology pathology conservative versus surgical therapies. He states that he has a night splint and he also has plantar fascial braces. He also states that he has orthotics. I injected the bilateral heels today with Kenalog and local anesthetic and I placed him on a Medrol Dosepak to be followed by meloxicam. We discussed appropriate shoe gear stretching exercises ice therapy issues or modifications. I will follow-up with him in 1 month. At that time he will bring me a copy of MRI  and the radiologist report. We may need to consider an over read secondary to the chronicity of his lateral ankle pain.

## 2015-03-06 ENCOUNTER — Ambulatory Visit (INDEPENDENT_AMBULATORY_CARE_PROVIDER_SITE_OTHER): Payer: BLUE CROSS/BLUE SHIELD | Admitting: Podiatry

## 2015-03-06 ENCOUNTER — Encounter: Payer: Self-pay | Admitting: Podiatry

## 2015-03-06 VITALS — BP 151/108 | HR 85 | Resp 18

## 2015-03-06 DIAGNOSIS — M7672 Peroneal tendinitis, left leg: Secondary | ICD-10-CM | POA: Diagnosis not present

## 2015-03-06 DIAGNOSIS — M722 Plantar fascial fibromatosis: Secondary | ICD-10-CM | POA: Diagnosis not present

## 2015-03-06 MED ORDER — TRAMADOL HCL 50 MG PO TABS: 50.0000 mg | ORAL_TABLET | Freq: Three times a day (TID) | ORAL | Status: DC | PRN

## 2015-03-06 MED ORDER — NAPROXEN 500 MG PO TABS: 500.0000 mg | ORAL_TABLET | Freq: Two times a day (BID) | ORAL | Status: DC

## 2015-03-06 NOTE — Progress Notes (Signed)
He presents today for follow-up of his bilateral foot pain left greater than right suffering from plantar fasciitis and peroneal tendinitis. He presents with his Spanish interpreter. He states that his feet are still painful the shots only helped for about a week and he is at the end of his rope as to what to do about this. He brought a CD with him today for his MRI bilateral foot.  Objective: Vital signs stable alert and oriented 3. Pulses are palpable. Neurologic sensorium is intact severe pain on palpation medial calcaneal tubercle bilateral. He has moderate to severe pain along the peroneal tendons of his left foot. Less degree on the right foot. No erythema edema saline as drainage or odor.  Assessment: Chronic intractable plantar fasciitis with peroneal tendinitis as a lateral compensatory-type syndrome. However I am concerned of possible tear of the peroneal tendon incision has been over a year.  Plan: At this point I gave him tramadol and naproxen to help alleviate his symptoms. I also suggested that we request an over read of his MRI. We also sent him to physical therapy at this point should the MRI come back abnormal we may need to stop the physical therapy and consider surgical intervention immediately.

## 2015-04-03 DIAGNOSIS — G4700 Insomnia, unspecified: Secondary | ICD-10-CM | POA: Insufficient documentation

## 2015-04-03 DIAGNOSIS — R0789 Other chest pain: Secondary | ICD-10-CM | POA: Insufficient documentation

## 2015-04-03 DIAGNOSIS — K2990 Gastroduodenitis, unspecified, without bleeding: Secondary | ICD-10-CM

## 2015-04-03 DIAGNOSIS — K2970 Gastritis, unspecified, without bleeding: Secondary | ICD-10-CM | POA: Insufficient documentation

## 2015-04-03 DIAGNOSIS — R0683 Snoring: Secondary | ICD-10-CM | POA: Insufficient documentation

## 2015-04-09 ENCOUNTER — Emergency Department
Admission: EM | Admit: 2015-04-09 | Discharge: 2015-04-09 | Disposition: A | Discharge status: Home / Self Care | Payer: BLUE CROSS/BLUE SHIELD | Attending: Emergency Medicine | Admitting: Emergency Medicine

## 2015-04-09 DIAGNOSIS — Z791 Long term (current) use of non-steroidal anti-inflammatories (NSAID): Secondary | ICD-10-CM | POA: Diagnosis not present

## 2015-04-09 DIAGNOSIS — Z79899 Other long term (current) drug therapy: Secondary | ICD-10-CM | POA: Diagnosis not present

## 2015-04-09 DIAGNOSIS — J0100 Acute maxillary sinusitis, unspecified: Secondary | ICD-10-CM | POA: Diagnosis not present

## 2015-04-09 DIAGNOSIS — R509 Fever, unspecified: Secondary | ICD-10-CM | POA: Diagnosis present

## 2015-04-09 MED ORDER — IBUPROFEN 600 MG PO TABS: 600.0000 mg | ORAL_TABLET | Freq: Three times a day (TID) | ORAL | Status: DC | PRN

## 2015-04-09 MED ORDER — FEXOFENADINE-PSEUDOEPHED ER 60-120 MG PO TB12: 1.0000 | ORAL_TABLET | Freq: Two times a day (BID) | ORAL | Status: DC

## 2015-04-09 MED ORDER — KETOROLAC TROMETHAMINE 60 MG/2ML IM SOLN
60.0000 mg | Freq: Once | INTRAMUSCULAR | Status: AC
  Administered 2015-04-09: 60 mg via INTRAMUSCULAR
  Filled 2015-04-09: qty 2

## 2015-04-09 NOTE — ED Notes (Signed)
Per interpreter, pt repoorts fever, HA and congestion since Friday; st seen at [facility] and rx amoxicillin for sinus infection without relief

## 2015-04-09 NOTE — Discharge Instructions (Signed)
Sinusitis, adultos  (Sinusitis, Adult)   La sinusitis es la irritación, dolor, e hinchazón (inflamación) de las cavidades de aire en los huesos de la cara (senos paranasales). La irritación, el dolor, e hinchazón puede hacer que el aire y el moco se queden atrapados en los senos paranasales. Esto hace que los gérmenes se multipliquen y causen una infección.   CUIDADOS EN EL HOGAR   · Beba gran cantidad de líquido para mantener el pis (orina) de tono claro o amarillo pálido.  · Use un humidificador en su hogar.  · Deje correr el agua caliente de la ducha para producir vapor en el baño. Siéntese en el baño con la puerta cerrada. Inhale vapor de agua 3 a 4 veces al día.  · Ponga un paño caliente y húmedo en el rostro 3 a 4 veces al día, o según las indicaciones de su médico.  · Use aerosoles de agua salada (aerosoles de solución salina) para humedecer las secreciones nasales espesas. Esto puede ayudar al drenaje de los senos paranasales.  · Sólo tome los medicamentos que le indique el médico.  SOLICITE AYUDA DE INMEDIATO SI:   · El dolor empeora.  · Siente un dolor de cabeza intenso.  · Tiene malestar estomacal (náuseas).  · Vomita.  · Tiene mucho sueño (somnolencia).  · El rostro está inflamado (hinchado).  · Hay cambios en la visión.  · Presenta rigidez en el cuello.  · Tiene dificultad para respirar.  ASEGÚRESE DE QUE:   · Comprende estas instrucciones.  · Controlará su enfermedad.  · Solicitará ayuda de inmediato si no mejora o si empeora.     Esta información no tiene como fin reemplazar el consejo del médico. Asegúrese de hacerle al médico cualquier pregunta que tenga.     Document Released: 10/28/2011 Document Revised: 06/19/2014  Elsevier Interactive Patient Education ©2016 Elsevier Inc.

## 2015-04-09 NOTE — ED Notes (Addendum)
States he developed headache  Fever and sinus pressure and congestion since Friday was seen on Sunday at [facility] and placed on amoxil and meds for flu.Marland Kitchen

## 2015-04-09 NOTE — ED Provider Notes (Signed)
[facility] Emergency Department Provider Note  ____________________________________________  Time seen: Approximately 7:30 AM  I have reviewed the triage vital signs and the nursing notes.   HISTORY  Chief Complaint Fever and Nasal Congestion  Via interpreter  HPI Allen Chapman is a 55 y.o. male patient follow-up family urgent care clinic who was seen 2 days ago. Patient state he was diagnosed with sinus infection and given amoxicillin and another medicine for cough. Patient states continue to have a sinus congestion and body aches. States had fever. He denies any nausea vomiting diarrhea. Patient is requesting IV antibiotics for this complaint.Patient rates his pain discomfort as a 10 over 10. Patient do not know the name of the other medication he was given.   Past Medical History  Diagnosis Date  . GERD (gastroesophageal reflux disease)   . Prostate enlargement   . Kidney stone   . Erectile dysfunction 07/02/2014  . Hematuria, microscopic 07/02/2014  . Inguinal hernia, left 07/02/2014  . Hypogonadism in male 07/02/2014    Patient Active Problem List   Diagnosis Date Noted  . Erectile dysfunction 07/02/2014  . Hypogonadism in male 07/02/2014  . Difficult or painful urination 05/19/2014  . Enlarged prostate 05/19/2014  . ED (erectile dysfunction) of organic origin 05/19/2014  . 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 Hernia, inguinal 05/19/2014  . Hematuria, microscopic 05/19/2014  . Prostatitis 05/19/2014  . Calculus of kidney 05/19/2014  . Excessive urination at night 07/01/2012  . Must strain to pass urine 07/01/2012  . Urgency of micturation 07/01/2012  . FOM (frequency of micturition) 07/01/2012    Past Surgical History  Procedure Laterality Date  . Appendectomy  1980  . Lithotripsy    . Cystoscopy  03/31/2014    Current Outpatient Rx  Name  Route  Sig  Dispense  Refill  . fexofenadine-pseudoephedrine (ALLEGRA-D) 60-120 MG 12 hr tablet  Oral   Take 1 tablet by mouth 2 (two) times daily.   20 tablet   0   . ibuprofen (ADVIL,MOTRIN) 600 MG tablet   Oral   Take 1 tablet (600 mg total) by mouth every 8 (eight) hours as needed for fever, headache or mild pain.   30 tablet   0   . meloxicam (MOBIC) 15 MG tablet   Oral   Take 1 tablet (15 mg total) by mouth daily.   30 tablet   3   . methylPREDNISolone (MEDROL) 4 MG TBPK tablet      Tapering 6 day dose pack   21 tablet   0   . naproxen (NAPROSYN) 500 MG tablet   Oral   Take 1 tablet (500 mg total) by mouth 2 (two) times daily with a meal.   60 tablet   1   . omeprazole (PRILOSEC) 40 MG capsule   Oral   Take 40 mg by mouth daily.         . traMADol (ULTRAM) 50 MG tablet   Oral   Take 1 tablet (50 mg total) by mouth every 8 (eight) hours as needed.   60 tablet   1     Allergies Nortriptyline and Flomax   No family history on file.  Social History Social History  Substance Use Topics  . Smoking status: Never Smoker   . Smokeless tobacco: Never Used  . Alcohol Use: No    Review of Systems Constitutional: Subjective fever and body aches Eyes: No visual changes. ENT: No sore throat. Nasal congestion Cardiovascular: Denies chest pain. Respiratory: Denies  shortness of breath. Gastrointestinal: No abdominal pain.  No nausea, no vomiting.  No diarrhea.  No constipation. Genitourinary: Negative for dysuria. Musculoskeletal: Negative for back pain. Skin: Negative for rash. Neurological: Negative for headaches, focal weakness or numbness. Allergic/Immunilogical: The medication list 10-point ROS otherwise negative.  ____________________________________________   PHYSICAL EXAM:  VITAL SIGNS: ED Triage Vitals  Enc Vitals Group     BP 04/09/15 0120 138/97 mmHg     Pulse Rate 04/09/15 0120 93     Resp 04/09/15 0120 20     Temp 04/09/15 0120 98.9 F (37.2 C)     Temp Source 04/09/15 0120 Oral     SpO2 04/09/15 0120 94 %     Weight 04/09/15  0120 165 lb (74.844 kg)     Height 04/09/15 0120  (1.676 m)     Head Cir --      Peak Flow --      Pain Score 04/09/15 0127 10     Pain Loc --      Pain Edu? --      Excl. in GC? --     Constitutional: Alert and oriented. Well appearing and in no acute distress. Eyes: Conjunctivae are normal. PERRL. EOMI. Head: Atraumatic. Nose: Bilateral maxillary guarding in bilateral edematous nasal turbinates.  Mouth/Throat: Mucous membranes are moist.  Oropharynx non-erythematous. Neck: No stridor.  No cervical spine tenderness to palpation. Hematological/Lymphatic/Immunilogical: No cervical lymphadenopathy. Cardiovascular: Normal rate, regular rhythm. Grossly normal heart sounds.  Good peripheral circulation. Respiratory: Normal respiratory effort.  No retractions. Lungs CTAB. Gastrointestinal: Soft and nontender. No distention. No abdominal bruits. No CVA tenderness. Musculoskeletal: No lower extremity tenderness nor edema.  No joint effusions. Neurologic:  Normal speech and language. No gross focal neurologic deficits are appreciated. No gait instability. Skin:  Skin is warm, dry and intact. No rash noted. Psychiatric: Mood and affect are normal. Speech and behavior are normal.  ____________________________________________   LABS (all labs ordered are listed, but only abnormal results are displayed)  Labs Reviewed - No data to display ____________________________________________  EKG   ____________________________________________  RADIOLOGY   ____________________________________________   PROCEDURES  Procedure(s) performed: None  Critical Care performed: No  ____________________________________________   INITIAL IMPRESSION / ASSESSMENT AND PLAN / ED COURSE  Pertinent labs & imaging results that were available during my care of the patient were reviewed by me and considered in my medical decision making (see chart for details).  Maxillary sinusitis. Patient  advised continue antibodies as directed. Patient given prescription for Allegra-D and ibuprofen. Patient advised to follow-up with family doctor is no improvement. Patient given a work note for today. ____________________________________________   FINAL CLINICAL IMPRESSION(S) / ED DIAGNOSES  Final diagnoses:  Subacute maxillary sinusitis      Joni Reining, PA-C 04/09/15 1610  Richardean Canal, MD 04/09/15 [phone]

## 2015-04-29 ENCOUNTER — Ambulatory Visit: Payer: BLUE CROSS/BLUE SHIELD | Admitting: Podiatry

## 2015-05-01 ENCOUNTER — Ambulatory Visit (INDEPENDENT_AMBULATORY_CARE_PROVIDER_SITE_OTHER): Payer: BLUE CROSS/BLUE SHIELD | Admitting: Podiatry

## 2015-05-01 ENCOUNTER — Encounter: Payer: Self-pay | Admitting: Podiatry

## 2015-05-01 VITALS — BP 139/92 | HR 90 | Resp 16

## 2015-05-01 DIAGNOSIS — M722 Plantar fascial fibromatosis: Secondary | ICD-10-CM | POA: Diagnosis not present

## 2015-05-01 DIAGNOSIS — M7672 Peroneal tendinitis, left leg: Secondary | ICD-10-CM | POA: Diagnosis not present

## 2015-05-01 MED ORDER — OXYCODONE-ACETAMINOPHEN 10-325 MG PO TABS: 1.0000 | ORAL_TABLET | Freq: Three times a day (TID) | ORAL | Status: DC | PRN

## 2015-05-01 NOTE — Progress Notes (Signed)
He presents today for a over read of his MRI. He drinks his wife with him who speaks [place] and [place]. She translates for him today explaining that he is still having severe pain to his left foot. He currently works for [employer] and does a lot of walking. He states that it only gets worse it has never gotten any better. He is requesting pain medication as well as surgical intervention. His wife goes on to state that she has recently lost her job and that he will be losing his insurance as of the end of March 2017. She states that he plans to pick up insurance from [employer] however this may be a pre-existing condition.  Objective: Vital signs are stable alert and oriented 3. He still has exquisite pain without edema and without erythema overlying the lateral aspect of the foot at the peroneal tubercle. He has pain on abduction against resistance. He has pain on palpation of the central band of the plantar fascia left foot. MRI states mild tendinopathy of the peroneal tendons. And mild tendinopathy of the posterior tibial tendon as well as a mild and fasciitis.  Assessment: Peroneal tendinopathy more than likely associated with plantar fasciitis.  Plan: I started him on Percocet to help make it through the day until we can perform surgery. He was also scanned for set of orthotics. I recommended that he make sure that there is no collapse and coverage between his wife insurance and his new insurance. I will follow-up with him once his orthotics are in.

## 2015-05-10 ENCOUNTER — Ambulatory Visit: Payer: BLUE CROSS/BLUE SHIELD | Attending: Neurology

## 2015-05-10 DIAGNOSIS — R0683 Snoring: Secondary | ICD-10-CM | POA: Insufficient documentation

## 2015-07-31 ENCOUNTER — Emergency Department: Payer: 59

## 2015-07-31 ENCOUNTER — Emergency Department
Admission: EM | Admit: 2015-07-31 | Discharge: 2015-07-31 | Disposition: A | Discharge status: Home / Self Care | Payer: 59 | Attending: Emergency Medicine | Admitting: Emergency Medicine

## 2015-07-31 DIAGNOSIS — N3289 Other specified disorders of bladder: Secondary | ICD-10-CM

## 2015-07-31 DIAGNOSIS — Z79899 Other long term (current) drug therapy: Secondary | ICD-10-CM | POA: Diagnosis not present

## 2015-07-31 DIAGNOSIS — N200 Calculus of kidney: Secondary | ICD-10-CM | POA: Diagnosis not present

## 2015-07-31 DIAGNOSIS — R301 Vesical tenesmus: Secondary | ICD-10-CM | POA: Insufficient documentation

## 2015-07-31 DIAGNOSIS — R319 Hematuria, unspecified: Secondary | ICD-10-CM | POA: Diagnosis present

## 2015-07-31 LAB — URINALYSIS COMPLETE WITH MICROSCOPIC (ARMC ONLY)
Bilirubin Urine: NEGATIVE
Glucose, UA: 50 mg/dL — AB
Ketones, ur: NEGATIVE mg/dL
Leukocytes, UA: NEGATIVE
Nitrite: NEGATIVE
Protein, ur: 100 mg/dL — AB
Specific Gravity, Urine: 1.03 (ref 1.005–1.030)
Squamous Epithelial / LPF: NONE SEEN
pH: 5 (ref 5.0–8.0)

## 2015-07-31 LAB — CBC
HCT: 42.5 % (ref 40.0–52.0)
Hemoglobin: 14.9 g/dL (ref 13.0–18.0)
MCH: 28.4 pg (ref 26.0–34.0)
MCHC: 35 g/dL (ref 32.0–36.0)
MCV: 81.1 fL (ref 80.0–100.0)
Platelets: 204 10*3/uL (ref 150–440)
RBC: 5.24 MIL/uL (ref 4.40–5.90)
RDW: 13.6 % (ref 11.5–14.5)
WBC: 5.7 10*3/uL (ref 3.8–10.6)

## 2015-07-31 LAB — COMPREHENSIVE METABOLIC PANEL
ALT: 24 U/L (ref 17–63)
AST: 22 U/L (ref 15–41)
Albumin: 4 g/dL (ref 3.5–5.0)
Alkaline Phosphatase: 66 U/L (ref 38–126)
Anion gap: 8 (ref 5–15)
BUN: 19 mg/dL (ref 6–20)
CO2: 24 mmol/L (ref 22–32)
Calcium: 8.8 mg/dL — ABNORMAL LOW (ref 8.9–10.3)
Chloride: 107 mmol/L (ref 101–111)
Creatinine, Ser: 0.94 mg/dL (ref 0.61–1.24)
GFR calc Af Amer: >60 mL/min (ref >60)
GFR calc non Af Amer: >60 mL/min (ref >60)
Glucose, Bld: 93 mg/dL (ref 65–99)
Potassium: 4 mmol/L (ref 3.5–5.1)
Sodium: 139 mmol/L (ref 135–145)
Total Bilirubin: 0.7 mg/dL (ref 0.3–1.2)
Total Protein: 6.8 g/dL (ref 6.5–8.1)

## 2015-07-31 MED ORDER — SODIUM CHLORIDE 0.9 % IV BOLUS (SEPSIS)
1000.0000 mL | Freq: Once | INTRAVENOUS | Status: AC
  Administered 2015-07-31: 1000 mL via INTRAVENOUS

## 2015-07-31 MED ORDER — ONDANSETRON HCL 4 MG/2ML IJ SOLN
4.0000 mg | Freq: Once | INTRAMUSCULAR | Status: AC
  Administered 2015-07-31: 4 mg via INTRAVENOUS
  Filled 2015-07-31: qty 2

## 2015-07-31 MED ORDER — MORPHINE SULFATE (PF) 4 MG/ML IV SOLN
4.0000 mg | Freq: Once | INTRAVENOUS | Status: AC
  Administered 2015-07-31: 4 mg via INTRAVENOUS
  Filled 2015-07-31: qty 1

## 2015-07-31 MED ORDER — OXYBUTYNIN CHLORIDE 5 MG PO TABS
5.0000 mg | ORAL_TABLET | Freq: Three times a day (TID) | ORAL | Status: DC
  Administered 2015-07-31: 5 mg via ORAL
  Filled 2015-07-31: qty 1

## 2015-07-31 MED ORDER — PHENAZOPYRIDINE HCL 100 MG PO TABS
95.0000 mg | ORAL_TABLET | Freq: Once | ORAL | Status: AC
  Administered 2015-07-31: 100 mg via ORAL
  Filled 2015-07-31: qty 1

## 2015-07-31 MED ORDER — OXYBUTYNIN CHLORIDE ER 10 MG PO TB24: 10.0000 mg | ORAL_TABLET | Freq: Every day | ORAL | Status: DC

## 2015-07-31 MED ORDER — CEPHALEXIN 500 MG PO CAPS: 500.0000 mg | ORAL_CAPSULE | Freq: Four times a day (QID) | ORAL | Status: AC

## 2015-07-31 MED ORDER — OXYCODONE-ACETAMINOPHEN 5-325 MG PO TABS: 1.0000 | ORAL_TABLET | Freq: Four times a day (QID) | ORAL | Status: DC | PRN

## 2015-07-31 MED ORDER — MORPHINE SULFATE (PF) 4 MG/ML IV SOLN
4.0000 mg | Freq: Once | INTRAVENOUS | Status: AC
Start: 2015-07-31 — End: 2015-07-31
  Administered 2015-07-31: 4 mg via INTRAVENOUS
  Filled 2015-07-31: qty 1

## 2015-07-31 MED ORDER — ONDANSETRON 4 MG PO TBDP: 4.0000 mg | ORAL_TABLET | Freq: Three times a day (TID) | ORAL | Status: DC | PRN

## 2015-07-31 MED ORDER — TAMSULOSIN HCL 0.4 MG PO CAPS: 0.4000 mg | ORAL_CAPSULE | Freq: Every day | ORAL | Status: DC

## 2015-07-31 NOTE — ED Provider Notes (Signed)
[facility] Emergency Department Provider Note   ____________________________________________  Time seen: Approximately 210 AM  I have reviewed the triage vital signs and the nursing notes.   HISTORY  Chief Complaint Back Pain; Abdominal Pain; and Hematuria    HPI Allen Chapman is a 55 y.o. male who comes into the hospital today with some left flank pain and difficulty with urination. The patient reports that it was bothering him when he urinated for about a day. He reports that today he noticed that his urine was dark. He was laying in the bed tonight*having some left-sided flank pain. He reports though he went to use the bathroom and noticed some blood in his urine. He had not been feeling very well. The patient reports that this is the first time it ever happened. He reports that his pain currently as a 6 out of 10 in intensity after a dose of morphine. He reports that he does have some discomfort in his suprapubic area and burning with urination. He reports that when he urinates a little bit comes out in drops. He reports that 16 years ago he did have a kidney stone on the left side which he needed surgery for that has not had new problems with it since then. He denies any fevers, denies vomiting, denies any chest pain or shortness of breath.   Past Medical History  Diagnosis Date  . GERD (gastroesophageal reflux disease)   . Prostate enlargement   . Kidney stone   . Erectile dysfunction 07/02/2014  . Hematuria, microscopic 07/02/2014  . Inguinal hernia, left 07/02/2014  . Hypogonadism in male 07/02/2014    Patient Active Problem List   Diagnosis Date Noted  . Erectile dysfunction 07/02/2014  . Hypogonadism in male 07/02/2014  . Difficult or painful urination 05/19/2014  . Enlarged prostate 05/19/2014  . ED (erectile dysfunction) of organic origin 05/19/2014  . 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 Hernia, inguinal 05/19/2014  . Hematuria, microscopic  05/19/2014  . Prostatitis 05/19/2014  . Calculus of kidney 05/19/2014  . Excessive urination at night 07/01/2012  . Must strain to pass urine 07/01/2012  . Urgency of micturation 07/01/2012  . FOM (frequency of micturition) 07/01/2012    Past Surgical History  Procedure Laterality Date  . Appendectomy  1980  . Lithotripsy    . Cystoscopy  03/31/2014    Current Outpatient Rx  Name  Route  Sig  Dispense  Refill  . cephALEXin (KEFLEX) 500 MG capsule   Oral   Take 1 capsule (500 mg total) by mouth 4 (four) times daily.   40 capsule   0   . fexofenadine-pseudoephedrine (ALLEGRA-D) 60-120 MG 12 hr tablet   Oral   Take 1 tablet by mouth 2 (two) times daily.   20 tablet   0   . ibuprofen (ADVIL,MOTRIN) 600 MG tablet   Oral   Take 1 tablet (600 mg total) by mouth every 8 (eight) hours as needed for fever, headache or mild pain.   30 tablet   0   . meloxicam (MOBIC) 15 MG tablet   Oral   Take 1 tablet (15 mg total) by mouth daily.   30 tablet   3   . naproxen (NAPROSYN) 500 MG tablet   Oral   Take 1 tablet (500 mg total) by mouth 2 (two) times daily with a meal.   60 tablet   1   . omeprazole (PRILOSEC) 40 MG capsule   Oral   Take 40 mg  by mouth daily.         . ondansetron (ZOFRAN ODT) 4 MG disintegrating tablet   Oral   Take 1 tablet (4 mg total) by mouth every 8 (eight) hours as needed for nausea or vomiting.   20 tablet   0   . oxybutynin (DITROPAN XL) 10 MG 24 hr tablet   Oral   Take 1 tablet (10 mg total) by mouth daily.   10 tablet   0   . oxyCODONE-acetaminophen (PERCOCET) 10-325 MG tablet   Oral   Take 1 tablet by mouth every 8 (eight) hours as needed for pain.   50 tablet   0   . oxyCODONE-acetaminophen (ROXICET) 5-325 MG tablet   Oral   Take 1 tablet by mouth every 6 (six) hours as needed.   12 tablet   0   . tamsulosin (FLOMAX) 0.4 MG CAPS capsule   Oral   Take 1 capsule (0.4 mg total) by mouth daily.   7 capsule   0   . temazepam  (RESTORIL) 15 MG capsule   Oral   Take 15 mg by mouth at bedtime.         . traMADol (ULTRAM) 50 MG tablet   Oral   Take 1 tablet (50 mg total) by mouth every 8 (eight) hours as needed.   60 tablet   1     Allergies Nortriptyline and Flomax   No family history on file.  Social History Social History  Substance Use Topics  . Smoking status: Never Smoker   . Smokeless tobacco: Never Used  . Alcohol Use: No    Review of Systems Constitutional: No fever/chills Eyes: No visual changes. ENT: No sore throat. Cardiovascular: Denies chest pain. Respiratory: Denies shortness of breath. Gastrointestinal: No abdominal pain.  No nausea, no vomiting.  No diarrhea.  No constipation. Genitourinary: Hematuria, dysuria Musculoskeletal: Left flank pain Skin: Negative for rash. Neurological: Negative for headaches, focal weakness or numbness.  10-point ROS otherwise negative.  ____________________________________________   PHYSICAL EXAM:  VITAL SIGNS: ED Triage Vitals  Enc Vitals Group     BP 07/31/15 0139 144/88 mmHg     Pulse Rate 07/31/15 0139 84     Resp 07/31/15 0139 18     Temp 07/31/15 0139 97.9 F (36.6 C)     Temp Source 07/31/15 0139 Oral     SpO2 07/31/15 0139 100 %     Weight --      Height --      Head Cir --      Peak Flow --      Pain Score 07/31/15 0142 10     Pain Loc --      Pain Edu? --      Excl. in GC? --     Constitutional: Alert and oriented. Well appearing and in Moderate distress. Eyes: Conjunctivae are normal. PERRL. EOMI. Head: Atraumatic. Nose: No congestion/rhinnorhea. Mouth/Throat: Mucous membranes are moist.  Oropharynx non-erythematous. Cardiovascular: Normal rate, regular rhythm. Grossly normal heart sounds.  Good peripheral circulation. Respiratory: Normal respiratory effort.  No retractions. Lungs CTAB. Gastrointestinal: Soft and nontender. No distention. Positive bowel sounds, left flank pain Musculoskeletal: No lower extremity  tenderness nor edema.   Neurologic:  Normal speech and language.  Skin:  Skin is warm, dry and intact.  Psychiatric: Mood and affect are normal.   ____________________________________________   LABS (all labs ordered are listed, but only abnormal results are displayed)  Labs Reviewed  COMPREHENSIVE METABOLIC PANEL -  Abnormal; Notable for the following:    Calcium 8.8 (*)    All other components within normal limits  URINALYSIS COMPLETEWITH MICROSCOPIC ([facility] ONLY) - Abnormal; Notable for the following:    Color, Urine RED (*)    APPearance CLOUDY (*)    Glucose, UA 50 (*)    Hgb urine dipstick 3+ (*)    Protein, ur 100 (*)    Bacteria, UA RARE (*)    All other components within normal limits  URINE CULTURE  CBC   ____________________________________________  EKG  None ____________________________________________  RADIOLOGY  CT renal stone study: 8 mm obstructing stone noted proximally at the left renal pelvis with associated mild prominence at the left renal calyces, bilateral renal cysts noted, enlarged prostate seen ____________________________________________   PROCEDURES  Procedure(s) performed: None  Critical Care performed: No  ____________________________________________   INITIAL IMPRESSION / ASSESSMENT AND PLAN / ED COURSE  Pertinent labs & imaging results that were available during my care of the patient were reviewed by me and considered in my medical decision making (see chart for details).  This is a 55 year old male who comes into the hospital today with some burning with urination and left flank pain. Appears as though the patient has a kidney stone. Given the patient's symptoms of burning with urination and suprapubic pain as feeling he also may have a urinary tract infection. I did give the patient 2 doses of morphine as well as liter of normal saline. The patient reports that he is allergic to Flomax. When I did receive the results of the CT scan  I contacted Dr. Ronne Binning who was the urologist on call. He reports that he did look at the CT scan and did not see any stranding along the ureter nor any signs of cystitis and the bladder. He reports that he does not feel that the patient has a urinary tract infection. He does not want [place] to give Toradol at this time in the event that the patient will need lithotripsy sooner rather than later. He reports though that the patient has a history of dizziness and that he thinks it is appropriate to give the patient Flomax and have him take it prior to bed. Otherwise he thinks the patient can be discharged home to follow-up in the office. I did give the patient a dose of ditropan and Pyridium but decided to also send him home with his prescription for Keflex. The patient does not have an elevated white blood cell count nor does he have any change in his creatinine. The patient will follow-up with the urologist in the office for further evaluation and possible lithotripsy. The patient reports that the Flomax also made him vomit so I did hold on the Flomax at this time. ____________________________________________   FINAL CLINICAL IMPRESSION(S) / ED DIAGNOSES  Final diagnoses:  Kidney stone  Bladder spasm      NEW MEDICATIONS STARTED DURING THIS VISIT:  Discharge Medication List as of 07/31/2015  6:46 AM    START taking these medications   Details  cephALEXin (KEFLEX) 500 MG capsule Take 1 capsule (500 mg total) by mouth 4 (four) times daily., Starting 07/31/2015, Until Sat 08/10/15, Print    ondansetron (ZOFRAN ODT) 4 MG disintegrating tablet Take 1 tablet (4 mg total) by mouth every 8 (eight) hours as needed for nausea or vomiting., Starting 07/31/2015, Until Discontinued, Print    oxyCODONE-acetaminophen (ROXICET) 5-325 MG tablet Take 1 tablet by mouth every 6 (six) hours as needed., Starting 07/31/2015,  Until Discontinued, Print    tamsulosin (FLOMAX) 0.4 MG CAPS capsule Take 1 capsule (0.4 mg total)  by mouth daily., Starting 07/31/2015, Until Discontinued, Print         Note:  This document was prepared using Dragon voice recognition software and may include unintentional dictation errors.    Rebecka Apley, MD 07/31/15 [phone]

## 2015-07-31 NOTE — ED Notes (Signed)
Patient to ED for complaints of back pain and umbilical pain with onset about 25 mins ago. Patient states he got up to go to the bathroom and saw red blood in his urine.

## 2015-07-31 NOTE — Discharge Instructions (Signed)
Disuria (Dysuria) La disuria es dolor o molestia al [employer]. El dolor o la molestia se pueden sentir en el conducto que transporta la orina fuera de la vejiga (uretra) o en el tejido que rodea los genitales. El dolor tambin se puede sentir en la zona de la ingle y en la parte inferior del abdomen y de la espalda. Quizs tenga que orinar con frecuencia o la sensacin repentina de tener que orinar (tenesmo vesical). La disuria puede afectar tanto a hombres como a mujeres, pero es ms comn en las mujeres. La causa puede deberse a muchos problemas diferentes:  Infeccin en las vas urinarias en mujeres.  Infeccin en los riones o la vejiga.  Clculos en los riones o la vejiga.  Ciertas enfermedades de transmisin sexual (ETS), como la clamidia.  Deshidratacin.  Inflamacin de la vagina.  Uso de ciertos medicamentos.  Uso de ciertos jabones o productos perfumados que provocan irritacin. INSTRUCCIONES PARA EL CUIDADO EN EL HOGAR Controle su disuria para ver si hay cambios. Las siguientes indicaciones pueden ayudar a Psychologist, educational [employer] pueda sentir:  Beba suficiente lquido para Pharmacologist la orina clara o de color amarillo plido.  Vace la vejiga con frecuencia. Evite retener la orina durante largos perodos.  Despus de defecar, las mujeres deben limpiarse desde adelante hacia atrs, usando el papel higinico solo [place].  Vace la vejiga despus de Management consultant.  Tome los medicamentos solamente como se lo haya indicado el mdico.  Si le recetaron antibiticos, asegrese de terminarlos, incluso si comienza a sentirse mejor.  Evite la cafena, el t y el alcohol. Estos productos pueden Theatre manager vejiga y Probation officer disuria. En los hombres, el alcohol puede irritar la prstata.  Concurra a todas las visitas de control como se lo haya indicado el mdico. Esto es importante.  Si le realizaron pruebas para Landscape architect causa de la disuria, es su  responsabilidad retirar los [place]. Consulte en el laboratorio o en el departamento en el que fue realizado el estudio cundo y cmo podr [employer]. Hable con el mdico si tiene [employer]. SOLICITE ATENCIN MDICA SI:  Siente dolor en la espalda o a los costados del cuerpo.  Tiene fiebre.  Tiene nuseas o vmitos.  Observa sangre en la orina.  Est orinando con ms frecuencia que lo habitual. SOLICITE ATENCIN MDICA DE INMEDIATO SI:  El dolor es intenso y no se alivia con los medicamentos.  No puede retener lquido.  Usted u otra persona advierten algn cambio en su funcin mental.  Tiene una frecuencia cardaca acelerada en reposo.  Tiene temblores o escalofros.  Se siente muy dbil.   Esta informacin no tiene Theme park manager el consejo del mdico. Asegrese de hacerle al mdico cualquier pregunta que tenga.   Document Released: 02/22/2007 Document Revised: 02/23/2014 Elsevier Interactive Patient Education 2016 [employer].  Clculos renales (Kidney Stones) Los clculos renales (urolitiasis) son masas slidas que se forman en el interior de los riones. El dolor intenso es causado por el movimiento de la piedra a travs del tracto urinario. Cuando la piedra se mueve, el urter hace un espasmo alrededor de la misma. El clculo generalmente se elimina con la [place].  CAUSAS   Un trastorno que hace que ciertas glndulas del cuello produzcan demasiada hormona paratiroidea (hiperparatiroidismo primario).  Una acumulacin de cristales de cido rico, similar a Brewing technologist.  Estrechamiento (constriccin) del urter.  Obstruccin en el rin presente al nacer (  obstruccin congnita).  Cirugas previas del rin o los urteres.  Numerosas infecciones renales. SNTOMAS   Ganas de vomitar (nuseas).  Devolver la comida (vomitar).  Sangre en la orina (hematuria).  Dolor que generalmente se expande  (irradia) hacia la ingle.  Ganas de orinar con frecuencia o de manera urgente. DIAGNSTICO   Historia clnica y examen fsico.  Anlisis de sangre y [place].  Tomografa computada.  En algunos casos se realiza un examen del interior de la vejiga (citoscopa). TRATAMIENTO   Observacin.  Aumentar la ingesta de lquidos.  Litotricia extracorprea con ondas de choque: es un procedimiento no invasivo que [place] Islandsutiliza ondas de choque para romper los clculos renales.  Ser necesaria la ciruga si tiene dolor muy intenso o la obstruccin persiste. Hay varios procedimientos quirrgicos. La mayora de los procedimientos se realizan con el uso de pequeos instrumentos. Slo es Passenger transport managernecesario realizar pequeas incisiones para acomodar estos instrumentos, por lo tanto el tiempo de recuperacin es mnimo. El tamao, la ubicacin y la composicin qumica de los clculos son variables importantes que determinarn la eleccin correcta de tratamiento para su caso. Comunquese con su mdico para comprender mejor su situacin, de modo que pueda ArvinMeritorminimizar los riesgos de lesiones para usted y su rin.  INSTRUCCIONES PARA EL CUIDADO EN EL HOGAR   Beba gran cantidad de lquido para mantener la orina de tono claro o color amarillo plido. Esto ayudar a eliminar las piedras o los fragmentos.  Cuele la orina con el colador que le han provisto. Guarde todas las partculas y piedras para que las vea el profesional que lo asiste. Puede ser tan pequea como un grano de sal. Es muy importante usar el colador cada vez que orine. La recoleccin de piedras permitir al [employer] analizar y Investment banker, operationalverificar que efectivamente ha eliminado una piedra. El anlisis de la piedra con frecuencia permitir identificar qu puede hacer para reducir la incidencia de las recurrencias.  Slo tome medicamentos de venta libre o recetados para Primary school teachercalmar el dolor, Environmental health practitionerel malestar o bajar la fiebre, segn las indicaciones de su mdico.  OceanographerConcurra a todas las  visitas de control como se lo haya indicado el mdico. Esto es importante.  Si se lo indica, hgase radiografas. La ausencia de dolor no siempre significa que las piedras se han eliminado. Puede ser que simplemente hayan dejado de moverse. Si el paso de orina permanece completamente obstruido, puede causar prdida de la funcin renal o simplemente la destruccin del rin. Es su responsabilidad Chartered certified accountantcompletar el seguimiento y las radiografas. Las ecografas del rin pueden mostrar una obstruccin y el estado del rin. Las ecografas no se asocian con la radiacin y pueden realizarse fcilmente en cuestin de minutos.  Haga cambios en la dieta diaria como se lo haya indicado el mdico. Es posible que le indiquen lo siguiente:  Limitar la cantidad de sal que consume.  Consumir 5 o ms porciones de frutas y Warehouse managerverduras por da.  Limitar la cantidad de carne, carne de ave, pescado y Fisher Scientifichuevos que consume.  Recoger Lauris Poaguna muestra de orina durante 24 horas como se lo haya indicado el mdico. Tal vez tenga que recoger otra muestra de orina cada 6 o 12 meses. SOLICITE ATENCIN MDICA SI:  Siente dolor que no responde a los Public affairs consultantanalgsicos que le recetaron. SOLICITE ATENCIN MDICA DE INMEDIATO SI:   No puede controlar el dolor con los medicamentos que le han recetado.  Siente escalofros o fiebre.  La gravedad o la intensidad del dolor aumenta durante 18 horas y no se  alivia con los analgsicos.  Presenta un nuevo episodio de dolor abdominal.  Sufre mareos o se desmaya.  No puede orinar.   Esta informacin no tiene Theme park manager el consejo del mdico. Asegrese de hacerle al mdico cualquier pregunta que tenga.   Document Released: 02/02/2005 Document Revised: 10/24/2014 Elsevier Interactive Patient Education 2016 [employer].  [employer] renal (Renal Colic) El clico renal es un dolor causado por el paso de un clculo en el rin. El dolor puede ser agudo e intenso. Puede sentirse en la espalda,  el abdomen, al costado (fosa lumbar) o la ingle. Puede causar nuseas. El clico renal puede aparecer y Geneticist, molecular. INSTRUCCIONES PARA EL CUIDADO EN EL HOGAR Controle su afeccin para ver si hay cambios. Las siguientes medidas pueden servir para Paramedic cualquier molestia que est sintiendo:  Tome los medicamentos solamente como se lo haya indicado el mdico.  Pregntele al mdico si puede tomar analgsicos de [place].  Beba suficiente lquido para Photographer orina clara o de color amarillo plido. Albesa Seen entre 6 y 8vasos de agua por Futures trader.  Limite la cantidad de sal que consume a menos de 2gramos por da.  Reduzca la cantidad de protenas de la dieta. Consuma menos carne, pescado, frutos secos y productos lcteos.  Evite alimentos como la espinaca, el ruibarbo, los frutos secos o el salvado, ya que pueden aumentar la probabilidad de que se formen clculos. SOLICITE ATENCIN MDICA SI:  Tiene fiebre o siente escalofros.  La orina se torna de color turbio o tiene [employer].  Siente dolor o ardor al Geographical information systems officer. SOLICITE ATENCIN MDICA DE INMEDIATO SI:  El dolor en la fosa lumbar o la ingle se intensifica repentinamente.  Est confundido o desorientado, o pierde la conciencia.   Esta informacin no tiene Theme park manager el consejo del mdico. Asegrese de hacerle al mdico cualquier pregunta que tenga.   Document Released: 11/12/2004 Document Revised: 02/23/2014 Elsevier Interactive Patient Education [employer]

## 2015-07-31 NOTE — ED Notes (Signed)
Patient transported to CT 

## 2015-08-01 LAB — URINE CULTURE: Culture: <10000 — AB

## 2015-08-15 ENCOUNTER — Ambulatory Visit: Payer: Self-pay | Admitting: Urology

## 2015-08-27 ENCOUNTER — Ambulatory Visit: Payer: Self-pay | Admitting: Urology

## 2015-09-10 ENCOUNTER — Ambulatory Visit (INDEPENDENT_AMBULATORY_CARE_PROVIDER_SITE_OTHER): Payer: 59 | Admitting: Urology

## 2015-09-10 ENCOUNTER — Encounter: Payer: Self-pay | Admitting: Urology

## 2015-09-10 ENCOUNTER — Ambulatory Visit
Admission: RE | Admit: 2015-09-10 | Discharge: 2015-09-10 | Disposition: A | Discharge status: Home / Self Care | Payer: 59 | Source: Ambulatory Visit | Attending: Urology | Admitting: Urology

## 2015-09-10 VITALS — BP 142/90 | HR 83 | Ht 66.0 in | Wt 162.0 lb

## 2015-09-10 DIAGNOSIS — N201 Calculus of ureter: Secondary | ICD-10-CM | POA: Insufficient documentation

## 2015-09-10 DIAGNOSIS — N402 Nodular prostate without lower urinary tract symptoms: Secondary | ICD-10-CM | POA: Diagnosis not present

## 2015-09-10 DIAGNOSIS — M722 Plantar fascial fibromatosis: Secondary | ICD-10-CM | POA: Insufficient documentation

## 2015-09-10 DIAGNOSIS — R3129 Other microscopic hematuria: Secondary | ICD-10-CM | POA: Insufficient documentation

## 2015-09-10 DIAGNOSIS — N200 Calculus of kidney: Secondary | ICD-10-CM

## 2015-09-10 DIAGNOSIS — I10 Essential (primary) hypertension: Secondary | ICD-10-CM | POA: Insufficient documentation

## 2015-09-10 DIAGNOSIS — N400 Enlarged prostate without lower urinary tract symptoms: Secondary | ICD-10-CM

## 2015-09-10 DIAGNOSIS — R351 Nocturia: Secondary | ICD-10-CM

## 2015-09-10 LAB — URINALYSIS, COMPLETE
Bilirubin, UA: NEGATIVE
Glucose, UA: NEGATIVE
Ketones, UA: NEGATIVE
Leukocytes, UA: NEGATIVE
Nitrite, UA: NEGATIVE
Protein, UA: NEGATIVE
Specific Gravity, UA: 1.025 (ref 1.005–1.030)
Urobilinogen, Ur: 0.2 mg/dL (ref 0.2–1.0)
pH, UA: 6 (ref 5.0–7.5)

## 2015-09-10 LAB — MICROSCOPIC EXAMINATION: Bacteria, UA: NONE SEEN

## 2015-09-10 NOTE — Progress Notes (Signed)
09/10/2015 11:04 AM   [name] 13-Oct-1960 [id number]  Referring provider: Preston Fleeting, MD [address] [place], [place] [address]  Chief Complaint  Patient presents with  . Nephrolithiasis    New Patient    HPI: 55 year old male who presents today for follow-up from the emergency room visit on 07/31/2015 for acute onset left flank pain. He underwent CT abdomen and pelvis without contrast which showed an 8 mm left obstructing stone within the renal pelvis.  Urine culture at the time was negative.  No leukocytosis.  Creatinine 0.88.  He does have a personal history of kidney stones.  He underwent ? laser Lithotripsy in 2011. No stone event since that time.  He continues to have intermittent left flank pain since presenting to the emergency room. He does not believe that these passed his stone.  Have significant urinary symptoms and was seen in our office by Michiel Cowboy approximate 2 years ago for this.  He was previously on Flomax but was not able to tolerate this due to hypotension. His primary complaint is nocturia x 4-5  During the daytime, his urinary symptoms are minimal.  He reports that his urianry flow is weak and he has prolonged urination with post void dribbling.  No hesitancy.  He feels that he is not completely emptly.    He is not had a rectal exam or PSA since his visit over 2 years ago.  PMH: Past Medical History:  Diagnosis Date  . Erectile dysfunction 07/02/2014  . GERD (gastroesophageal reflux disease)   . Hematuria, microscopic 07/02/2014  . Hypogonadism in male 07/02/2014  . Inguinal hernia, left 07/02/2014  . Kidney stone   . Prostate enlargement     Surgical History: Past Surgical History:  Procedure Laterality Date  . APPENDECTOMY  1980  . CYSTOSCOPY  03/31/2014  . LITHOTRIPSY      Home Medications:    Medication List       Accurate as of 09/10/15 11:04 AM. Always use your most recent med list.            omeprazole 40 MG capsule Commonly known as:  PRILOSEC Take 40 mg by mouth daily.   temazepam 15 MG capsule Commonly known as:  RESTORIL Take 15 mg by mouth at bedtime.       Allergies:  Allergies  Allergen Reactions  . Nortriptyline Shortness Of Breath  . Flomax  [Tamsulosin Hcl]     Other reaction(s): Dizziness    Family History: Family History  Problem Relation Age of Onset  . Bladder Cancer Neg Hx   . Kidney cancer Neg Hx   . Prostate cancer Neg Hx     Social History:  reports that he has never smoked. He has never used smokeless tobacco. He reports that he does not drink alcohol or use drugs.  ROS: UROLOGY Frequent Urination?: Yes Hard to postpone urination?: No Burning/pain with urination?: Yes Get up at night to urinate?: Yes Leakage of urine?: No Urine stream starts and stops?: Yes Trouble starting stream?: No Do you have to strain to urinate?: No Blood in urine?: Yes Urinary tract infection?: No Sexually transmitted disease?: No Injury to kidneys or bladder?: No Painful intercourse?: No Weak stream?: No Erection problems?: No Penile pain?: No  Gastrointestinal Nausea?: No Vomiting?: No Indigestion/heartburn?: No Diarrhea?: No Constipation?: No  Constitutional Fever: No Night sweats?: No Weight loss?: No Fatigue?: No  Skin Skin rash/lesions?: No Itching?: No  Eyes Blurred vision?: No Double vision?:  No  Ears/Nose/Throat Sore throat?: No Sinus problems?: No  Hematologic/Lymphatic Swollen glands?: No Easy bruising?: No  Cardiovascular Leg swelling?: No Chest pain?: No  Respiratory Cough?: No Shortness of breath?: No  Endocrine Excessive thirst?: No  Musculoskeletal Back pain?: No Joint pain?: No  Neurological Headaches?: Yes Dizziness?: No  Psychologic Depression?: No Anxiety?: No  Physical Exam: BP (!) 142/90   Pulse 83   Ht 5\' 6"  (1.676 m)   Wt 162 lb (73.5 kg)   BMI 26.15 kg/m   Constitutional:  Alert  and oriented, No acute distress. HEENT: [place] AT, moist mucus membranes.  Trachea midline, no masses. Cardiovascular: No clubbing, cyanosis, or edema. Respiratory: Normal respiratory effort, no increased work of breathing. GI: Abdomen is soft, nontender, nondistended, no abdominal masses GU: No CVA tenderness.  DRE: Normal sphincter tone.  50 cc prostate.  0.5 cm rubbery nodule at left base of prostate. Skin: No rashes, bruises or suspicious lesions. Lymph: No cervical or inguinal adenopathy. Neurologic: Grossly intact, no focal deficits, moving all 4 extremities. Psychiatric: Normal mood and affect.  Laboratory Data: Lab Results  Component Value Date   WBC 5.7 07/31/2015   HGB 14.9 07/31/2015   HCT 42.5 07/31/2015   MCV 81.1 07/31/2015   PLT 204 07/31/2015    Lab Results  Component Value Date   CREATININE 0.94 07/31/2015    Urinalysis Results for orders placed or performed in visit on 09/10/15  Microscopic Examination  Result Value Ref Range   WBC, UA 0-5 0 - 5 /hpf   RBC, UA 11-30 (A) 0 - 2 /hpf   Epithelial Cells (non renal) 0-10 0 - 10 /hpf   Mucus, UA Present (A) Not Estab.   Bacteria, UA None seen None seen/Few  Urinalysis, Complete  Result Value Ref Range   Specific Gravity, UA 1.025 1.005 - 1.030   pH, UA 6.0 5.0 - 7.5   Color, UA Yellow Yellow   Appearance Ur Hazy (A) Clear   Leukocytes, UA Negative Negative   Protein, UA Negative Negative/Trace   Glucose, UA Negative Negative   Ketones, UA Negative Negative   RBC, UA 2+ (A) Negative   Bilirubin, UA Negative Negative   Urobilinogen, Ur 0.2 0.2 - 1.0 mg/dL   Nitrite, UA Negative Negative   Microscopic Examination See below:     Pertinent Imaging: Study Result   CLINICAL DATA:  Acute onset of left flank pain and periumbilical pain. Hematuria. Initial encounter.  EXAM: CT ABDOMEN AND PELVIS WITHOUT CONTRAST  TECHNIQUE: Multidetector CT imaging of the abdomen and pelvis was performed following the  standard protocol without IV contrast.  COMPARISON:  CT of the abdomen and pelvis from 12/20/2013  FINDINGS: Minimal bibasilar atelectasis is noted.  The liver and spleen are unremarkable in appearance. The gallbladder is within normal limits. The pancreas and adrenal glands are unremarkable.  There is an 8 mm obstructing stone noted at the left renal pelvis, with slight prominence of the left renal calyces.  Bilateral renal cysts are seen, measuring up to 6.8 cm in size. There is no evidence of hydronephrosis. No ureteral stones are identified. Mild nonspecific perinephric stranding is noted bilaterally.  No free fluid is identified. The small bowel is unremarkable in appearance. The stomach is within normal limits. No acute vascular abnormalities are seen.  The appendix is normal in caliber, without evidence of appendicitis. The colon is grossly unremarkable in appearance.  The bladder is mildly distended and grossly unremarkable. The prostate is enlarged, measuring 5.1  cm in transverse dimension. No inguinal lymphadenopathy is seen.  No acute osseous abnormalities are identified.  IMPRESSION: 1. 8 mm obstructing stone noted proximally at the left renal pelvis, with associated mild prominence of the left renal calyces. 2. Bilateral renal cysts noted. 3. Enlarged prostate seen.   Electronically Signed   By: Roanna Raider M.D.   On: 07/31/2015 03:21   CT scan personally reviewed today and with the patient.  Assessment & Plan:    1. Calculus of kidney 8 mm right proximal ureteral stone, obstructing and intermittently symptomatic. No evidence of infection. The stone has failed to pass in greater than a month, I would recommend surgical intervention for the stone.  We discussed various treatment options including ESWL vs. ureteroscopy, laser lithotripsy, and stent. We discussed the risks and benefits of both including bleeding, infection, damage to  surrounding structures, efficacy with need for possible further intervention, and need for temporary ureteral stent.  Discussed the difference and efficacy rates between the 2 procedures.  Most interested in ESWL.  We will plan for a KUB today and if visible on KUB, will schedule ESWL. All of his questions were answered today. He was accompanied today by a Engineer, structural to help facilitate her conversation.  - Urinalysis, Complete - CULTURE, URINE COMPREHENSIVE  2. Microscopic hematuria We secondary to ureteral stone, will follow-up with you after stone has been adequately treated. - DG Abd 1 View  3. BPH (benign prostatic hyperplasia) with LUTS Significant obstructive urinary symptoms. May consider finasteride treatment given the size of the gland pending PSA/ assessment of need for prostate biopsy.   - PSA  4. Nocturia As above.  Behavioral modification was discussed today at length.  5. Prostate nodule Incidental nodule at left base of prostate today which is somewhat suspicious. I recommended a PSA today and will discuss this further with the patient after his stone is treated. He may need a prostate biopsy based on the exam findings. We will defer starting finasteride until we've made that assessment.  Vanna Scotland, MD  [facility] [address], [address] [place], [place] [address] [phone]

## 2015-09-11 ENCOUNTER — Telehealth: Payer: Self-pay

## 2015-09-11 LAB — PSA: Prostate Specific Ag, Serum: 0.8 ng/mL (ref 0.0–4.0)

## 2015-09-11 NOTE — Telephone Encounter (Signed)
LMOM- via interpretor

## 2015-09-11 NOTE — Telephone Encounter (Signed)
-----   Message from Vanna Scotland, MD sent at 09/10/2015  3:48 PM EDT ----- Please use a translator to let the patient know that his stone did show up on XRAY therefore will plan for ESWL as scheduled.    Vanna Scotland, MD

## 2015-09-13 LAB — CULTURE, URINE COMPREHENSIVE

## 2015-09-17 ENCOUNTER — Telehealth: Payer: Self-pay | Admitting: *Deleted

## 2015-09-17 NOTE — Telephone Encounter (Signed)
-----   Message from Ashley Brandon, MD sent at 09/10/2015  3:48 PM EDT ----- Please use a translator to let the patient know that his stone did show up on XRAY therefore will plan for ESWL as scheduled.    Ashley Brandon, MD  

## 2015-09-17 NOTE — Telephone Encounter (Signed)
LMOM advising pt should report to [facility] on 09/19/15 at 8:15 for ESWL.

## 2015-09-17 NOTE — Telephone Encounter (Signed)
Using the language line Interpreter Antonio [phone] tried to contact patient he lmom for patient to call us back using the interpreter service.

## 2015-09-17 NOTE — Telephone Encounter (Signed)
Pt's wife called stating pt c/o cold symptoms & body aches.  Proceed with ESWL scheduled 09/19/15 or r/s?

## 2015-09-17 NOTE — Telephone Encounter (Signed)
Yes, its in 2 days and we can evaluate him on the AM of surgery.  Vanna Scotland, MD

## 2015-09-18 NOTE — Telephone Encounter (Signed)
Pt's wife called stating ESWL will need to be rescheduled due to sickness. She will call back to confirm date.

## 2015-09-20 ENCOUNTER — Other Ambulatory Visit: Payer: Self-pay

## 2015-09-20 ENCOUNTER — Telehealth: Payer: Self-pay | Admitting: Radiology

## 2015-09-20 DIAGNOSIS — N200 Calculus of kidney: Secondary | ICD-10-CM

## 2015-09-20 NOTE — Telephone Encounter (Signed)
Pt called to r/s ESWL. Advised pt that Dr Apolinar Junes is concerned that his flu-like symptoms may be caused by an infected kidney stone & she would like him to RTC today for ucx. Pt states he can not come today but that he would RTC on 09/23/15 at 8:00 for repeat ucx. Appt made.

## 2015-09-23 ENCOUNTER — Other Ambulatory Visit: Payer: 59

## 2015-09-23 DIAGNOSIS — N200 Calculus of kidney: Secondary | ICD-10-CM

## 2015-09-23 LAB — URINALYSIS, COMPLETE
Bilirubin, UA: NEGATIVE
Glucose, UA: NEGATIVE
Ketones, UA: NEGATIVE
Leukocytes, UA: NEGATIVE
Nitrite, UA: NEGATIVE
Protein, UA: NEGATIVE
Specific Gravity, UA: 1.02 (ref 1.005–1.030)
Urobilinogen, Ur: 0.2 mg/dL (ref 0.2–1.0)
pH, UA: 5.5 (ref 5.0–7.5)

## 2015-09-23 LAB — MICROSCOPIC EXAMINATION: Epithelial Cells (non renal): NONE SEEN /hpf (ref 0–10)

## 2015-09-23 NOTE — Telephone Encounter (Signed)
Pt RTC for urine culture & to discuss rescheduling ESWL with Dr Apolinar JunesBrandon.  Pt states he is feeling better & is not available for ESWL on 8/10 but will be on 10/03/15. [facility]. Pre-ESWL instructions given to pt & questions answered. Advised pt if he develops fever or chills to call office immediately. Pt voices understanding.

## 2015-09-25 LAB — CULTURE, URINE COMPREHENSIVE

## 2015-09-25 MED ORDER — CEFAZOLIN IN D5W 1 GM/50ML IV SOLN
INTRAVENOUS | Status: AC
  Filled 2015-09-25: qty 50

## 2015-09-30 ENCOUNTER — Telehealth: Payer: Self-pay | Admitting: Radiology

## 2015-09-30 NOTE — Telephone Encounter (Signed)
Per Dr Apolinar JunesBrandon pt needs to RTC asap to discuss URS d/t shingles. Pt has obstructing stone & is at risk of losing kidney.  Interpreter Devonne Doughtynali #[id number] [facility] appt with Dr Berneice HeinrichManny on 10/01/15 @9 :15 to discuss URS. Also LMOM of wife with same info.

## 2015-09-30 NOTE — Telephone Encounter (Signed)
Pt states he has had shingles on his left flank area x 1 week. He is scheduled for left ESWL on 10/03/15. Please advise on how to proceed.

## 2015-10-01 ENCOUNTER — Ambulatory Visit: Payer: 59

## 2015-10-02 ENCOUNTER — Other Ambulatory Visit: Payer: Self-pay | Admitting: Radiology

## 2015-10-02 DIAGNOSIS — N201 Calculus of ureter: Secondary | ICD-10-CM

## 2015-10-02 NOTE — Telephone Encounter (Signed)
LMOM. Need to discuss surgery information. 

## 2015-10-03 ENCOUNTER — Emergency Department
Admission: EM | Admit: 2015-10-03 | Discharge: 2015-10-03 | Disposition: A | Discharge status: Home / Self Care | Payer: 59 | Source: Home / Self Care | Attending: Emergency Medicine | Admitting: Emergency Medicine

## 2015-10-03 ENCOUNTER — Encounter: Admission: AD | Payer: Self-pay | Source: Ambulatory Visit

## 2015-10-03 ENCOUNTER — Encounter: Payer: Self-pay | Admitting: Emergency Medicine

## 2015-10-03 ENCOUNTER — Emergency Department
Admission: EM | Admit: 2015-10-03 | Discharge: 2015-10-04 | Disposition: A | Discharge status: Home / Self Care | Payer: 59 | Attending: Emergency Medicine | Admitting: Emergency Medicine

## 2015-10-03 ENCOUNTER — Emergency Department: Payer: 59

## 2015-10-03 ENCOUNTER — Ambulatory Visit: Admission: AD | Admit: 2015-10-03 | Payer: 59 | Source: Ambulatory Visit | Admitting: Urology

## 2015-10-03 DIAGNOSIS — N200 Calculus of kidney: Secondary | ICD-10-CM | POA: Insufficient documentation

## 2015-10-03 DIAGNOSIS — I10 Essential (primary) hypertension: Secondary | ICD-10-CM | POA: Insufficient documentation

## 2015-10-03 DIAGNOSIS — Z79899 Other long term (current) drug therapy: Secondary | ICD-10-CM | POA: Insufficient documentation

## 2015-10-03 DIAGNOSIS — R109 Unspecified abdominal pain: Secondary | ICD-10-CM | POA: Diagnosis present

## 2015-10-03 DIAGNOSIS — Z791 Long term (current) use of non-steroidal anti-inflammatories (NSAID): Secondary | ICD-10-CM | POA: Diagnosis not present

## 2015-10-03 LAB — URINALYSIS COMPLETE WITH MICROSCOPIC (ARMC ONLY)
Bacteria, UA: NONE SEEN
Bilirubin Urine: NEGATIVE
Glucose, UA: NEGATIVE mg/dL
Ketones, ur: NEGATIVE mg/dL
Leukocytes, UA: NEGATIVE
Nitrite: NEGATIVE
Protein, ur: NEGATIVE mg/dL
Specific Gravity, Urine: 1.014 (ref 1.005–1.030)
Squamous Epithelial / LPF: NONE SEEN
pH: 6 (ref 5.0–8.0)

## 2015-10-03 LAB — BASIC METABOLIC PANEL
Anion gap: 7 (ref 5–15)
BUN: 18 mg/dL (ref 6–20)
CO2: 27 mmol/L (ref 22–32)
Calcium: 8.9 mg/dL (ref 8.9–10.3)
Chloride: 103 mmol/L (ref 101–111)
Creatinine, Ser: 1.16 mg/dL (ref 0.61–1.24)
GFR calc Af Amer: >60 mL/min (ref >60)
GFR calc non Af Amer: >60 mL/min (ref >60)
Glucose, Bld: 90 mg/dL (ref 65–99)
Potassium: 4.1 mmol/L (ref 3.5–5.1)
Sodium: 137 mmol/L (ref 135–145)

## 2015-10-03 LAB — CBC
HCT: 46.8 % (ref 40.0–52.0)
Hemoglobin: 16.2 g/dL (ref 13.0–18.0)
MCH: 28.5 pg (ref 26.0–34.0)
MCHC: 34.5 g/dL (ref 32.0–36.0)
MCV: 82.6 fL (ref 80.0–100.0)
Platelets: 188 10*3/uL (ref 150–440)
RBC: 5.66 MIL/uL (ref 4.40–5.90)
RDW: 14.1 % (ref 11.5–14.5)
WBC: 10 10*3/uL (ref 3.8–10.6)

## 2015-10-03 SURGERY — LITHOTRIPSY, ESWL
Anesthesia: Moderate Sedation | Laterality: Left

## 2015-10-03 MED ORDER — ONDANSETRON HCL 4 MG/2ML IJ SOLN
4.0000 mg | Freq: Once | INTRAMUSCULAR | Status: AC
  Administered 2015-10-03: 4 mg via INTRAVENOUS

## 2015-10-03 MED ORDER — IBUPROFEN 200 MG PO TABS: 600.0000 mg | ORAL_TABLET | Freq: Four times a day (QID) | ORAL | 0 refills | Status: DC | PRN

## 2015-10-03 MED ORDER — HYDROMORPHONE HCL 1 MG/ML IJ SOLN
INTRAMUSCULAR | Status: DC
Start: 2015-10-03 — End: 2015-10-03
  Filled 2015-10-03: qty 1

## 2015-10-03 MED ORDER — HYDROMORPHONE HCL 1 MG/ML IJ SOLN
INTRAMUSCULAR | Status: AC
  Administered 2015-10-04: 1 mg via INTRAVENOUS
  Filled 2015-10-03: qty 1

## 2015-10-03 MED ORDER — ONDANSETRON HCL 4 MG/2ML IJ SOLN
INTRAMUSCULAR | Status: AC
  Filled 2015-10-03: qty 2

## 2015-10-03 MED ORDER — ONDANSETRON HCL 4 MG/2ML IJ SOLN
4.0000 mg | Freq: Once | INTRAMUSCULAR | Status: AC
  Administered 2015-10-04: 4 mg via INTRAVENOUS
  Filled 2015-10-03: qty 2

## 2015-10-03 MED ORDER — SODIUM CHLORIDE 0.9 % IV BOLUS (SEPSIS)
1000.0000 mL | Freq: Once | INTRAVENOUS | Status: AC
  Administered 2015-10-03: 1000 mL via INTRAVENOUS

## 2015-10-03 MED ORDER — KETOROLAC TROMETHAMINE 60 MG/2ML IM SOLN
INTRAMUSCULAR | Status: AC
  Filled 2015-10-03: qty 2

## 2015-10-03 MED ORDER — FENTANYL CITRATE (PF) 100 MCG/2ML IJ SOLN
INTRAMUSCULAR | Status: DC
Start: 2015-10-03 — End: 2015-10-03
  Filled 2015-10-03: qty 2

## 2015-10-03 MED ORDER — HYDROMORPHONE HCL 1 MG/ML IJ SOLN
1.0000 mg | Freq: Once | INTRAMUSCULAR | Status: AC
  Administered 2015-10-04: 1 mg via INTRAVENOUS
  Filled 2015-10-03: qty 1

## 2015-10-03 MED ORDER — ONDANSETRON HCL 4 MG/2ML IJ SOLN
INTRAMUSCULAR | Status: AC
  Administered 2015-10-04: 4 mg via INTRAVENOUS
  Filled 2015-10-03: qty 2

## 2015-10-03 MED ORDER — ONDANSETRON HCL 4 MG PO TABS: 4.0000 mg | ORAL_TABLET | Freq: Three times a day (TID) | ORAL | 0 refills | Status: DC | PRN

## 2015-10-03 MED ORDER — OXYCODONE-ACETAMINOPHEN 5-325 MG PO TABS: 1.0000 | ORAL_TABLET | Freq: Four times a day (QID) | ORAL | 0 refills | Status: DC | PRN

## 2015-10-03 MED ORDER — KETOROLAC TROMETHAMINE 30 MG/ML IJ SOLN
30.0000 mg | Freq: Once | INTRAMUSCULAR | Status: AC
  Administered 2015-10-03: 30 mg via INTRAVENOUS

## 2015-10-03 MED ORDER — DOCUSATE SODIUM 100 MG PO CAPS: 100.0000 mg | ORAL_CAPSULE | Freq: Every day | ORAL | 2 refills | Status: AC | PRN

## 2015-10-03 MED ORDER — FENTANYL CITRATE (PF) 100 MCG/2ML IJ SOLN
50.0000 ug | INTRAMUSCULAR | Status: DC | PRN
  Administered 2015-10-03: 50 ug via INTRAVENOUS

## 2015-10-03 MED ORDER — HYDROMORPHONE HCL 1 MG/ML IJ SOLN
1.0000 mg | Freq: Once | INTRAMUSCULAR | Status: AC
  Administered 2015-10-03: 1 mg via INTRAVENOUS

## 2015-10-03 NOTE — ED Triage Notes (Addendum)
Reports left flank pain.  Recently dx with kidney stone at urologist.  Reports was supposed to have lithotripsy today  but they cancelled it bc he has shingles.  Mask applied.

## 2015-10-03 NOTE — ED Triage Notes (Signed)
Patient presents to ED with c/o left flank pain, diagnosed with kidney stone. Pt reports was seen here today with same symptoms reports Percocet has not helped. Pt reports was supposed to have lithotripsy today but was cancelled due to shingles.

## 2015-10-03 NOTE — Telephone Encounter (Signed)
Pt's wife called stating pt is in severe pain with nausea & requests pain medication. Per Michiel CowboyShannon McGowan, advised that pt should go to the ER right away. Advised that postponing could cause damage to the kidney. Wife voices understanding & states she will tell the pt.

## 2015-10-03 NOTE — ED Provider Notes (Addendum)
[facility] Emergency Department Provider Note  ____________________________________________   I have reviewed the triage vital signs and the nursing notes.   HISTORY  Chief Complaint Flank Pain    HPI Allen Chapman is a 55 y.o. male  with a history of a kidney stone, which is 8 mm and obstructing. He has been followed up with by urology. He has not been able to get in to have it performed. According to Dr. Fransico MichaelBrennan who is gracious enough to take the call, he has had multiple different doctors to go in and have lithotripsy but they had great difficulty Compazine this. He is scheduled for an outpatient stent placement. Patient does have a shingles like lesion on his left thigh which is been there for 2 weeks and is not very painful at this time. For the lesions on his left thigh which are now healed. Patient cannot tolerate Flomax as it makes him lightheaded. He is complaining of significant ongoing pain for the last several months in his left flank which is not improved. He denies any fever or vomiting or diarrhea.     Past Medical History:  Diagnosis Date  . Erectile dysfunction 07/02/2014  . GERD (gastroesophageal reflux disease)   . Hematuria, microscopic 07/02/2014  . Hypogonadism in male 07/02/2014  . Inguinal hernia, left 07/02/2014  . Kidney stone   . Prostate enlargement     Patient Active Problem List   Diagnosis Date Noted  . HTN (hypertension) 09/10/2015  . Plantar fasciitis of left foot 09/10/2015  . Chest wall pain 04/03/2015  . Gastritis and gastroduodenitis 04/03/2015  . Insomnia 04/03/2015  . Snoring 04/03/2015  . Chronic insomnia 10/31/2014  . Elevated BP 10/31/2014  . Gastroesophageal reflux disease without esophagitis 10/31/2014  . Erectile dysfunction 07/02/2014  . Hypogonadism in male 07/02/2014  . Difficult or painful urination 05/19/2014  . Enlarged prostate 05/19/2014  . ED (erectile dysfunction) of organic origin 05/19/2014   . 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 Hernia, inguinal 05/19/2014  . Hematuria, microscopic 05/19/2014  . Prostatitis 05/19/2014  . Calculus of kidney 05/19/2014  . Diarrhea 01/17/2014  . Excessive urination at night 07/01/2012  . Must strain to pass urine 07/01/2012  . Urgency of micturation 07/01/2012  . FOM (frequency of micturition) 07/01/2012    Past Surgical History:  Procedure Laterality Date  . APPENDECTOMY  1980  . CYSTOSCOPY  03/31/2014  . LITHOTRIPSY      Current Outpatient Rx  . Order #: [id number] Class: Historical Med  . Order #: [id number] Class: Historical Med    Allergies Nortriptyline and Flomax  [tamsulosin hcl]  Family History  Problem Relation Age of Onset  . Bladder Cancer Neg Hx   . Kidney cancer Neg Hx   . Prostate cancer Neg Hx     Social History Social History  Substance Use Topics  . Smoking status: Never Smoker  . Smokeless tobacco: Never Used  . Alcohol use No    Review of Systems Constitutional: No fever/chills Eyes: No visual changes. ENT: No sore throat. No stiff neck no neck pain Cardiovascular: Denies chest pain. Respiratory: Denies shortness of breath. Gastrointestinal:   no vomiting.  No diarrhea.  No constipation. Genitourinary: Negative for dysuria. Musculoskeletal: Negative lower extremity swelling Skin: Negative for rash. Neurological: Negative for severe headaches, focal weakness or numbness. 10-point ROS otherwise negative.  ____________________________________________   PHYSICAL EXAM:  VITAL SIGNS: ED Triage Vitals [10/03/15 1408]  Enc Vitals Group     BP  Pulse      Resp      Temp      Temp src      SpO2      Weight 162 lb (73.5 kg)     Height 5\' 6"  (1.676 m)     Head Circumference      Peak Flow      Pain Score 10     Pain Loc      Pain Edu?      Excl. in GC?     Constitutional: Alert and oriented. Nontoxic but uncomfortable Eyes: Conjunctivae are normal. PERRL. EOMI. Head: Atraumatic. Nose: No  congestion/rhinnorhea. Mouth/Throat: Mucous membranes are moist.  Oropharynx non-erythematous. Neck: No stridor.   Nontender with no meningismus Cardiovascular: Normal rate, regular rhythm. Grossly normal heart sounds.  Good peripheral circulation. Respiratory: Normal respiratory effort.  No retractions. Lungs CTAB. Abdominal: Soft and nontender. No distention. No guarding no rebound Back:  There is no focal tenderness or step off.  there is no midline tenderness there are no lesions noted. there is Left CVA tenderness Musculoskeletal: No lower extremity tenderness, no upper extremity tenderness. No joint effusions, no DVT signs strong distal pulses no edema Neurologic:  Normal speech and language. No gross focal neurologic deficits are appreciated.  Skin:  Skin is warm, dry and intact. There are 4 small 1-2 mm blanchable lesions on the left thigh which appeared to be well healed. There is no fluctuance or erythema surrounding them there is no induration and there is no proximal lymphadenopathy. They're not tender.Marland Kitchen. Psychiatric: Mood and affect are normal. Speech and behavior are normal.  ____________________________________________   LABS (all labs ordered are listed, but only abnormal results are displayed)  Labs Reviewed  CBC  BASIC METABOLIC PANEL  URINALYSIS COMPLETEWITH MICROSCOPIC ([facility] ONLY)   ____________________________________________  EKG  I personally interpreted any EKGs ordered by me or triage  ____________________________________________  RADIOLOGY  I reviewed any imaging ordered by me or triage that were performed during my shift and, if possible, patient and/or family made aware of any abnormal findings. ____________________________________________   PROCEDURES  Procedure(s) performed: None  Procedures  Critical Care performed: None  ____________________________________________   INITIAL IMPRESSION / ASSESSMENT AND PLAN / ED COURSE  Pertinent labs &  imaging results that were available during my care of the patient were reviewed by me and considered in my medical decision making (see chart for details).  Patient here for pain control, he will call for a ride home he understands she must not drive after Dilaudid. We will send him home with Percocet. Did discuss with urology. They agree with management and they will continue to follow up with the patient to the extent that he is compliant with follow-up as an outpatient. Nothing at this time to suggest infected stone or significant renal impairment.  Clinical Course   ____________________________________________   FINAL CLINICAL IMPRESSION(S) / ED DIAGNOSES  Final diagnoses:  None      This chart was dictated using voice recognition software.  Despite best efforts to proofread,  errors can occur which can change meaning.      Jeanmarie PlantJames A McShane, MD 10/03/15 1612    Jeanmarie PlantJames A McShane, MD 10/03/15 2209

## 2015-10-04 ENCOUNTER — Emergency Department: Payer: 59

## 2015-10-04 MED ORDER — ONDANSETRON 4 MG PO TBDP: 4.0000 mg | ORAL_TABLET | Freq: Three times a day (TID) | ORAL | 0 refills | Status: DC | PRN

## 2015-10-04 MED ORDER — OXYCODONE-ACETAMINOPHEN 5-325 MG PO TABS: 2.0000 | ORAL_TABLET | ORAL | 0 refills | Status: DC | PRN

## 2015-10-04 MED ORDER — SODIUM CHLORIDE 0.9 % IV BOLUS (SEPSIS)
1000.0000 mL | Freq: Once | INTRAVENOUS | Status: AC
  Administered 2015-10-04: 1000 mL via INTRAVENOUS

## 2015-10-04 MED ORDER — ONDANSETRON 4 MG PO TBDP
4.0000 mg | ORAL_TABLET | Freq: Once | ORAL | Status: AC
  Administered 2015-10-04: 4 mg via ORAL
  Filled 2015-10-04: qty 1

## 2015-10-04 MED ORDER — HYDROMORPHONE HCL 1 MG/ML IJ SOLN
1.0000 mg | Freq: Once | INTRAMUSCULAR | Status: AC
  Administered 2015-10-04: 1 mg via INTRAVENOUS

## 2015-10-04 MED ORDER — OXYCODONE-ACETAMINOPHEN 5-325 MG PO TABS
2.0000 | ORAL_TABLET | Freq: Once | ORAL | Status: AC
  Administered 2015-10-04: 2 via ORAL
  Filled 2015-10-04: qty 2

## 2015-10-04 NOTE — ED Provider Notes (Signed)
[facility] Emergency Department Provider Note  ____________________________________________   First MD Initiated Contact with Patient 10/03/15 2356     (approximate)  I have reviewed the triage vital signs and the nursing notes.   HISTORY  Chief Complaint Flank Pain    HPI Allen Chapman is a 55 y.o. male diagnosed kidney stone today attended to the emergency department with 10 out of 10 left flank pain unrelieved with prescribed Percocet to home. Patient denies any fever does however admit to nausea.   Past Medical History:  Diagnosis Date  . Erectile dysfunction 07/02/2014  . GERD (gastroesophageal reflux disease)   . Hematuria, microscopic 07/02/2014  . Hypogonadism in male 07/02/2014  . Inguinal hernia, left 07/02/2014  . Kidney stone   . Prostate enlargement     Patient Active Problem List   Diagnosis Date Noted  . HTN (hypertension) 09/10/2015  . Plantar fasciitis of left foot 09/10/2015  . Chest wall pain 04/03/2015  . Gastritis and gastroduodenitis 04/03/2015  . Insomnia 04/03/2015  . Snoring 04/03/2015  . Chronic insomnia 10/31/2014  . Elevated BP 10/31/2014  . Gastroesophageal reflux disease without esophagitis 10/31/2014  . Erectile dysfunction 07/02/2014  . Hypogonadism in male 07/02/2014  . Difficult or painful urination 05/19/2014  . Enlarged prostate 05/19/2014  . ED (erectile dysfunction) of organic origin 05/19/2014  . 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 Hernia, inguinal 05/19/2014  . Hematuria, microscopic 05/19/2014  . Prostatitis 05/19/2014  . Calculus of kidney 05/19/2014  . Diarrhea 01/17/2014  . Excessive urination at night 07/01/2012  . Must strain to pass urine 07/01/2012  . Urgency of micturation 07/01/2012  . FOM (frequency of micturition) 07/01/2012    Past Surgical History:  Procedure Laterality Date  . APPENDECTOMY  1980  . CYSTOSCOPY  03/31/2014  . LITHOTRIPSY      Prior to Admission medications     Medication Sig Start Date End Date Taking? Authorizing Provider  docusate sodium (COLACE) 100 MG capsule Take 1 capsule (100 mg total) by mouth daily as needed. 10/03/15 10/02/16  Jeanmarie PlantJames A McShane, MD  ibuprofen (MOTRIN IB) 200 MG tablet Take 3 tablets (600 mg total) by mouth every 6 (six) hours as needed. 10/03/15   Jeanmarie PlantJames A McShane, MD  omeprazole (PRILOSEC) 40 MG capsule Take 40 mg by mouth daily.    Historical Provider, MD  ondansetron (ZOFRAN) 4 MG tablet Take 1 tablet (4 mg total) by mouth every 8 (eight) hours as needed for nausea or vomiting. 10/03/15   Jeanmarie PlantJames A McShane, MD  oxyCODONE-acetaminophen (ROXICET) 5-325 MG tablet Take 1 tablet by mouth every 6 (six) hours as needed. 10/03/15   Jeanmarie PlantJames A McShane, MD  temazepam (RESTORIL) 15 MG capsule Take 15 mg by mouth at bedtime. 07/21/15   Historical Provider, MD    Allergies Nortriptyline and Flomax  [tamsulosin hcl]  Family History  Problem Relation Age of Onset  . Bladder Cancer Neg Hx   . Kidney cancer Neg Hx   . Prostate cancer Neg Hx     Social History Social History  Substance Use Topics  . Smoking status: Never Smoker  . Smokeless tobacco: Never Used  . Alcohol use No    Review of Systems Constitutional: No fever/chills Eyes: No visual changes. ENT: No sore throat. Cardiovascular: Denies chest pain. Respiratory: Denies shortness of breath. Gastrointestinal: No abdominal pain.  No nausea, no vomiting.  No diarrhea.  No constipation. Genitourinary: Negative for dysuria. Musculoskeletal: Negative for back pain.Positive for left flank pain Skin:  Negative for rash. Neurological: Negative for headaches, focal weakness or numbness.  10-point ROS otherwise negative.  ____________________________________________   PHYSICAL EXAM:  VITAL SIGNS: ED Triage Vitals  Enc Vitals Group     BP 10/03/15 2257 139/86     Pulse Rate 10/03/15 2257 81     Resp 10/03/15 2257 18     Temp 10/03/15 2257 98.2 F (36.8 C)     Temp Source  10/03/15 2257 Oral     SpO2 10/03/15 2257 97 %     Weight 10/03/15 2258 162 lb (73.5 kg)     Height 10/03/15 2258 5\' 6"  (1.676 m)     Head Circumference --      Peak Flow --      Pain Score 10/03/15 2258 10     Pain Loc --      Pain Edu? --      Excl. in GC? --     Constitutional: Alert and oriented. Apparent discomfort Eyes: Conjunctivae are normal. PERRL. EOMI. Head: Atraumatic. Ears:  Healthy appearing ear canals and TMs bilaterally Nose: No congestion/rhinnorhea. Mouth/Throat: Mucous membranes are moist.  Oropharynx non-erythematous. Neck: No stridor.  No meningeal signs.  Cardiovascular: Normal rate, regular rhythm. Good peripheral circulation. Grossly normal heart sounds.   Respiratory: Normal respiratory effort.  No retractions. Lungs CTAB. Gastrointestinal: Soft and nontender. No distention.  Musculoskeletal: No lower extremity tenderness nor edema. No gross deformities of extremities. Neurologic:  Normal speech and language. No gross focal neurologic deficits are appreciated.  Skin:  Skin is warm, dry and intact. No rash noted. Psychiatric: Mood and affect are normal. Speech and behavior are normal.  ____________________________________________   LABS (all labs ordered are listed, but only abnormal results are displayed)  Labs Reviewed - No data to display ______________________________  RADIOLOGY I, Lithonia Dewayne Shorter, personally viewed and evaluated these images (plain radiographs) as part of my medical decision making, as well as reviewing the written report by the radiologist.  Dg Abdomen 1 View  Result Date: 10/03/2015 CLINICAL DATA:  Kidney stone with left flank pain. EXAM: ABDOMEN - 1 VIEW COMPARISON:  09/10/2015 FINDINGS: 1 cm calcification overlying the left renal pelvis unchanged from the prior study. No right renal calculi. Normal bowel gas pattern. No acute skeletal abnormality. Sclerotic lesion proximal left femur unchanged appearing benign. IMPRESSION: 1  cm left renal calculus unchanged in position. Electronically Signed   By: Marlan Palau M.D.   On: 10/03/2015 16:13    ______________  Procedures    INITIAL IMPRESSION / ASSESSMENT AND PLAN / ED COURSE  Pertinent labs & imaging results that were available during my care of the patient were reviewed by me and considered in my medical decision making (see chart for details).  Patient discussed with Dr. Marlou Porch urologist on call. Pain control after 2 doses of Dilantin current pain score 3 out of 10. Patient pain management at home will be altered to 2 Percocets every 4 hours. Patient is advised to return to emergency department immediately pain is not controlled or fever were to ensue.  Clinical Course    ____________________________________________  FINAL CLINICAL IMPRESSION(S) / ED DIAGNOSES  Final diagnoses:  None     MEDICATIONS GIVEN DURING THIS VISIT:  Medications  sodium chloride 0.9 % bolus 1,000 mL (not administered)  HYDROmorphone (DILAUDID) injection 1 mg (1 mg Intravenous Given 10/04/15 0001)  ondansetron (ZOFRAN) injection 4 mg (4 mg Intravenous Given 10/04/15 0001)     NEW OUTPATIENT MEDICATIONS STARTED DURING THIS VISIT:  New Prescriptions   No medications on file      Note:  This document was prepared using Dragon voice recognition software and may include unintentional dictation errors.    Darci Currentandolph N Swayzie Choate, MD 10/04/15 [phone]

## 2015-10-04 NOTE — Telephone Encounter (Signed)
Pt's wife called stating pt was seen in the ER twice yesterday & was given the option of placing a stent vs. Pain medication at home.  Pt opted to go home with pain medication. Wife states pt is now vomiting but pain is under control. States he will take medication given in the ER for nausea. Advised that per Michiel CowboyShannon McGowan pt should return to ER for further evaluation due to vomiting. Wife states they will wait to see if the nausea medication improves symptoms before returning to ER. Again urged wife to return pt to ER. Again wife states they will return if nausea/vomiting doesn't improve with medication.

## 2015-10-08 ENCOUNTER — Encounter: Payer: Self-pay | Admitting: Urology

## 2015-10-08 ENCOUNTER — Ambulatory Visit (INDEPENDENT_AMBULATORY_CARE_PROVIDER_SITE_OTHER): Payer: 59 | Admitting: Urology

## 2015-10-08 VITALS — BP 151/90 | HR 92 | Ht 66.0 in | Wt 162.7 lb

## 2015-10-08 DIAGNOSIS — N200 Calculus of kidney: Secondary | ICD-10-CM

## 2015-10-08 MED ORDER — TRAMADOL HCL 50 MG PO TABS: 50.0000 mg | ORAL_TABLET | Freq: Four times a day (QID) | ORAL | 0 refills | Status: DC | PRN

## 2015-10-08 NOTE — Progress Notes (Signed)
Patient presents today for further discussion of management of his left distal ureteral stone.  The patient has been seen and evaluated numerous times by urology and scheduled for several different procedures. He was most recently canceled for a flareup of shingles. The patient states that since being started on treatment for shingles and symptoms of grossly improved. Shingles were located on his left gluteal muscle. This point, he is no longer having pain.  The patient was seen over the weekend in the emergency room for pain. He was able to be discharged home on oral pain medications. He denies any fevers or chills. He denies a nausea/vomiting.  Current Outpatient Prescriptions on File Prior to Visit  Medication Sig Dispense Refill  . omeprazole (PRILOSEC) 40 MG capsule Take 40 mg by mouth daily.    . ondansetron (ZOFRAN) 4 MG tablet Take 1 tablet (4 mg total) by mouth every 8 (eight) hours as needed for nausea or vomiting. 8 tablet 0  . oxyCODONE-acetaminophen (ROXICET) 5-325 MG tablet Take 2 tablets by mouth every 4 (four) hours as needed for severe pain. 30 tablet 0  . temazepam (RESTORIL) 15 MG capsule Take 15 mg by mouth at bedtime.    . docusate sodium (COLACE) 100 MG capsule Take 1 capsule (100 mg total) by mouth daily as needed. (Patient not taking: Reported on 10/08/2015) 30 capsule 2  . ondansetron (ZOFRAN ODT) 4 MG disintegrating tablet Take 1 tablet (4 mg total) by mouth every 8 (eight) hours as needed for nausea or vomiting. (Patient not taking: Reported on 10/08/2015) 20 tablet 0  . oxyCODONE-acetaminophen (ROXICET) 5-325 MG tablet Take 1 tablet by mouth every 6 (six) hours as needed. (Patient not taking: Reported on 10/08/2015) 18 tablet 0  . predniSONE (DELTASONE) 10 MG tablet Take 1 tablet by mouth as directed. taper    . valACYclovir (VALTREX) 1000 MG tablet Take 1 tablet by mouth 3 (three) times daily. For 7 days  0   No current facility-administered medications on file prior to  visit.    PE: Patient is in no acute distress Vitals:   10/08/15 1631  BP: (!) 151/90  Pulse: 92  Patient's abdomen is soft, nontender, nondistended Mild left CVA tenderness Corners sized area over the left gluteal muscle of a ruptured and scabbed vesicles that are nontender to palpation  CT scan from the emergency room performed on 10/03/15: I have independently reviewed the patient's CT scan and reviewed the images with the patient. This demonstrates a 7 times 6 mm stone proximally at the left UPJ.  Impression: Left UPJ stone  Recommendation: We discussed the treatment options again for the patient including shockwave lithotripsy versus ureteroscopy. After going to the ureteroscopic procedure, the patient has ultimately opted to be placed back on the shockwave lithotripsy scheduled. He understands that there is a lower rate is stone free status following shockwave of the trips he compared to ureteroscopy. He also understands that he may have some pain following the procedure. However, the patient is reluctant to consider ureteroscopy because he is reluctant to have a ureteral stent placed. I think that since the patient's shingles symptoms have resolved he would be a reasonable candidate for shockwave lithotripsy. As such, we have posted him for the next available procedure.

## 2015-10-09 ENCOUNTER — Other Ambulatory Visit: Payer: 59

## 2015-10-09 ENCOUNTER — Other Ambulatory Visit: Payer: Self-pay | Admitting: Radiology

## 2015-10-09 MED ORDER — MIDAZOLAM HCL 2 MG/2ML IJ SOLN: 1.0000 mg | Freq: Once | INTRAMUSCULAR | Status: DC

## 2015-10-09 MED ORDER — DIAZEPAM 5 MG PO TABS: 10.0000 mg | ORAL_TABLET | ORAL | Status: DC

## 2015-10-09 MED ORDER — DEXTROSE-NACL 5-0.45 % IV SOLN: INTRAVENOUS | Status: DC

## 2015-10-09 MED ORDER — DIPHENHYDRAMINE HCL 25 MG PO CAPS: 25.0000 mg | ORAL_CAPSULE | ORAL | Status: DC

## 2015-10-09 MED ORDER — CIPROFLOXACIN HCL 500 MG PO TABS: 500.0000 mg | ORAL_TABLET | ORAL | Status: DC

## 2015-10-10 ENCOUNTER — Encounter
Admission: RE | Disposition: A | Discharge status: Home / Self Care | Payer: Self-pay | Source: Ambulatory Visit | Attending: Urology

## 2015-10-10 ENCOUNTER — Other Ambulatory Visit: Payer: Self-pay | Admitting: Urology

## 2015-10-10 ENCOUNTER — Telehealth: Payer: Self-pay

## 2015-10-10 ENCOUNTER — Ambulatory Visit: Payer: 59

## 2015-10-10 ENCOUNTER — Ambulatory Visit
Admission: RE | Admit: 2015-10-10 | Discharge: 2015-10-10 | Disposition: A | Discharge status: Home / Self Care | Payer: 59 | Source: Ambulatory Visit | Attending: Urology | Admitting: Urology

## 2015-10-10 ENCOUNTER — Encounter: Payer: Self-pay | Admitting: *Deleted

## 2015-10-10 ENCOUNTER — Encounter: Payer: Self-pay | Admitting: Radiology

## 2015-10-10 DIAGNOSIS — N201 Calculus of ureter: Secondary | ICD-10-CM | POA: Diagnosis present

## 2015-10-10 DIAGNOSIS — Z87442 Personal history of urinary calculi: Secondary | ICD-10-CM | POA: Insufficient documentation

## 2015-10-10 DIAGNOSIS — N200 Calculus of kidney: Secondary | ICD-10-CM

## 2015-10-10 HISTORY — PX: EXTRACORPOREAL SHOCK WAVE LITHOTRIPSY: SHX1557

## 2015-10-10 SURGERY — LITHOTRIPSY, ESWL
Anesthesia: Moderate Sedation | Laterality: Left

## 2015-10-10 MED ORDER — DIAZEPAM 5 MG PO TABS
10.0000 mg | ORAL_TABLET | ORAL | Status: AC
  Administered 2015-10-10: 10 mg via ORAL

## 2015-10-10 MED ORDER — CEFAZOLIN SODIUM-DEXTROSE 2-4 GM/100ML-% IV SOLN: 2.0000 g | INTRAVENOUS | Status: DC

## 2015-10-10 MED ORDER — CIPROFLOXACIN HCL 500 MG PO TABS
500.0000 mg | ORAL_TABLET | Freq: Once | ORAL | Status: AC
  Administered 2015-10-10: 500 mg via ORAL

## 2015-10-10 MED ORDER — DIAZEPAM 5 MG PO TABS
ORAL_TABLET | ORAL | Status: AC
  Administered 2015-10-10: 10 mg via ORAL
  Filled 2015-10-10: qty 2

## 2015-10-10 MED ORDER — MIDAZOLAM HCL 2 MG/2ML IJ SOLN: 1.0000 mg | Freq: Once | INTRAMUSCULAR | Status: DC

## 2015-10-10 MED ORDER — DIPHENHYDRAMINE HCL 25 MG PO CAPS
ORAL_CAPSULE | ORAL | Status: AC
  Administered 2015-10-10: 25 mg via ORAL
  Filled 2015-10-10: qty 1

## 2015-10-10 MED ORDER — DEXTROSE-NACL 5-0.45 % IV SOLN: INTRAVENOUS | Status: DC

## 2015-10-10 MED ORDER — DIPHENHYDRAMINE HCL 25 MG PO CAPS
25.0000 mg | ORAL_CAPSULE | ORAL | Status: AC
  Administered 2015-10-10: 25 mg via ORAL

## 2015-10-10 MED ORDER — CIPROFLOXACIN HCL 500 MG PO TABS
ORAL_TABLET | ORAL | Status: AC
  Administered 2015-10-10: 500 mg via ORAL
  Filled 2015-10-10: qty 1

## 2015-10-10 MED ORDER — OXYCODONE-ACETAMINOPHEN 5-325 MG PO TABS
ORAL_TABLET | ORAL | Status: AC
  Administered 2015-10-10: 1 via ORAL
  Filled 2015-10-10: qty 1

## 2015-10-10 MED ORDER — OXYCODONE-ACETAMINOPHEN 5-325 MG PO TABS
1.0000 | ORAL_TABLET | ORAL | Status: DC | PRN
  Administered 2015-10-10: 1 via ORAL

## 2015-10-10 NOTE — OR Nursing (Signed)
Pt voided a small amount of bloody urine, few clots seen, reinforced that bloody urine is normal for 24-36 hours..  Strained urine, no stones seen.

## 2015-10-10 NOTE — Telephone Encounter (Signed)
The pt wife came by to pick up work note. She wanted to know if you could prescribe a different pain medication other then Tramadol, because its causing headaches. Please advise

## 2015-10-10 NOTE — Discharge Instructions (Signed)

## 2015-10-11 ENCOUNTER — Encounter: Payer: Self-pay | Admitting: Urology

## 2015-10-11 ENCOUNTER — Telehealth: Payer: Self-pay

## 2015-10-11 NOTE — Telephone Encounter (Signed)
The pt wife came back requesting pain medication and a work note for her today. I spoke w/ Dr. Sherron MondayMacDiarmid and he stated that he would not give a work note to the pt wife for the second day of recovery for lithotripsy. I informed her that Percocet was given the day of the procedure and that he could take OTC pain medication in between for break through pain. She verbalize understanding.

## 2015-10-12 LAB — CULTURE, URINE COMPREHENSIVE

## 2015-10-15 ENCOUNTER — Encounter: Admission: RE | Payer: Self-pay | Source: Ambulatory Visit

## 2015-10-15 ENCOUNTER — Ambulatory Visit: Admission: RE | Admit: 2015-10-15 | Payer: 59 | Source: Ambulatory Visit | Admitting: Urology

## 2015-10-15 SURGERY — URETEROSCOPY, WITH LITHOTRIPSY USING HOLMIUM LASER
Anesthesia: Choice | Laterality: Left

## 2015-10-30 ENCOUNTER — Ambulatory Visit: Payer: 59

## 2015-11-13 ENCOUNTER — Ambulatory Visit: Payer: 59

## 2015-11-22 ENCOUNTER — Ambulatory Visit (INDEPENDENT_AMBULATORY_CARE_PROVIDER_SITE_OTHER): Payer: 59 | Admitting: Urology

## 2015-11-22 ENCOUNTER — Ambulatory Visit
Admission: RE | Admit: 2015-11-22 | Discharge: 2015-11-22 | Disposition: A | Discharge status: Home / Self Care | Payer: 59 | Source: Ambulatory Visit | Attending: Urology | Admitting: Urology

## 2015-11-22 ENCOUNTER — Encounter: Payer: Self-pay | Admitting: Urology

## 2015-11-22 VITALS — BP 127/83 | HR 71 | Ht 66.0 in | Wt 162.4 lb

## 2015-11-22 DIAGNOSIS — N200 Calculus of kidney: Secondary | ICD-10-CM

## 2015-11-22 DIAGNOSIS — N400 Enlarged prostate without lower urinary tract symptoms: Secondary | ICD-10-CM | POA: Diagnosis not present

## 2015-11-22 LAB — URINALYSIS, COMPLETE
Bilirubin, UA: NEGATIVE
Glucose, UA: NEGATIVE
Ketones, UA: NEGATIVE
Leukocytes, UA: NEGATIVE
Nitrite, UA: NEGATIVE
Protein, UA: NEGATIVE
RBC, UA: NEGATIVE
Specific Gravity, UA: >1.03 — ABNORMAL HIGH (ref 1.005–1.030)
Urobilinogen, Ur: 0.2 mg/dL (ref 0.2–1.0)
pH, UA: 5 (ref 5.0–7.5)

## 2015-11-22 LAB — MICROSCOPIC EXAMINATION
Bacteria, UA: NONE SEEN
Epithelial Cells (non renal): NONE SEEN /hpf (ref 0–10)
WBC, UA: NONE SEEN /hpf (ref 0–?)

## 2015-11-22 LAB — BLADDER SCAN AMB NON-IMAGING: Scan Result: 0

## 2015-11-22 MED ORDER — FINASTERIDE 5 MG PO TABS: 5.0000 mg | ORAL_TABLET | Freq: Every day | ORAL | 11 refills | Status: DC

## 2015-11-22 NOTE — Progress Notes (Signed)
11/22/2015 10:58 AM   Allen Chapman [birth year] [id number]  Referring provider: [name], MD [address] [facility] [address] [facility] [place], [place] [address]  Chief Complaint  Patient presents with  . Follow-up    calculus of kidney, KUB results     HPI: The patient is a 55 year old gentleman who presents today for evaluation after undergoing lithotripsy for a left 7 mm UPJ stone.    1. Nephrolithiasis The patient has passed multiple stone fragments since his surgery. His colic pain has resolved. He is planning of pain in his left flank that is made worse with movement and better with exercise. This is a different type of pain then his stone pain. It is slowly improving.  2. BPH The patient has significant urinary symptoms at this time. He has nocturia 5-6. He notes a weak stream. He has urinary frequency and urgency. His PVR today is 0 cc. He was on Flomax to pass his stones which he thought helped his urinary symptoms however he became very lightheaded we'll taking this medication.   PMH: Past Medical History:  Diagnosis Date  . Erectile dysfunction 07/02/2014  . GERD (gastroesophageal reflux disease)   . Hematuria, microscopic 07/02/2014  . Hypogonadism in male 07/02/2014  . Inguinal hernia, left 07/02/2014  . Kidney stone   . Prostate enlargement     Surgical History: Past Surgical History:  Procedure Laterality Date  . APPENDECTOMY  1980  . CYSTOSCOPY  03/31/2014  . EXTRACORPOREAL SHOCK WAVE LITHOTRIPSY Left 10/10/2015   Procedure: EXTRACORPOREAL SHOCK WAVE LITHOTRIPSY (ESWL);  Surgeon: Hildred Laser, MD;  Location: [facility] ORS;  Service: Urology;  Laterality: Left;  . LITHOTRIPSY      Home Medications:    Medication List       Accurate as of 11/22/15 10:58 AM. Always use your most recent med list.          docusate sodium 100 MG capsule Commonly known as:  COLACE Take 1 capsule (100 mg total) by mouth daily as needed.   finasteride 5 MG  tablet Commonly known as:  PROSCAR Take 1 tablet (5 mg total) by mouth daily.   omeprazole 40 MG capsule Commonly known as:  PRILOSEC Take 40 mg by mouth daily.   ondansetron 4 MG tablet Commonly known as:  ZOFRAN Take 1 tablet (4 mg total) by mouth every 8 (eight) hours as needed for nausea or vomiting.   oxyCODONE-acetaminophen 5-325 MG tablet Commonly known as:  ROXICET Take 1 tablet by mouth every 6 (six) hours as needed.   oxyCODONE-acetaminophen 5-325 MG tablet Commonly known as:  ROXICET Take 2 tablets by mouth every 4 (four) hours as needed for severe pain.   predniSONE 10 MG tablet Commonly known as:  DELTASONE Take 1 tablet by mouth as directed. taper   temazepam 15 MG capsule Commonly known as:  RESTORIL Take 15 mg by mouth at bedtime.   traMADol 50 MG tablet Commonly known as:  ULTRAM Take 1 tablet (50 mg total) by mouth every 6 (six) hours as needed.   valACYclovir 1000 MG tablet Commonly known as:  VALTREX Take 1 tablet by mouth 3 (three) times daily. For 7 days       Allergies:  Allergies  Allergen Reactions  . Nortriptyline Shortness Of Breath  . Flomax  [Tamsulosin Hcl]     Other reaction(s): Dizziness    Family History: Family History  Problem Relation Age of Onset  . Bladder Cancer Neg Hx   . Kidney  cancer Neg Hx   . Prostate cancer Neg Hx     Social History:  reports that he has never smoked. He has never used smokeless tobacco. He reports that he does not drink alcohol or use drugs.  ROS: UROLOGY Frequent Urination?: Yes Hard to postpone urination?: Yes Burning/pain with urination?: Yes Get up at night to urinate?: Yes Leakage of urine?: Yes Urine stream starts and stops?: No Trouble starting stream?: No Do you have to strain to urinate?: No Blood in urine?: No Urinary tract infection?: No Sexually transmitted disease?: No Injury to kidneys or bladder?: No Painful intercourse?: No Weak stream?: Yes Erection problems?:  No Penile pain?: No  Gastrointestinal Nausea?: No Vomiting?: No Indigestion/heartburn?: No Diarrhea?: No Constipation?: No  Constitutional Fever: No Night sweats?: No Weight loss?: Yes Fatigue?: No  Skin Skin rash/lesions?: No Itching?: No  Eyes Blurred vision?: No Double vision?: No  Ears/Nose/Throat Sore throat?: No Sinus problems?: No  Hematologic/Lymphatic Swollen glands?: No Easy bruising?: Yes  Cardiovascular Leg swelling?: No Chest pain?: No  Respiratory Cough?: No Shortness of breath?: No  Endocrine Excessive thirst?: No  Musculoskeletal Back pain?: Yes Joint pain?: No  Neurological Headaches?: Yes Dizziness?: No  Psychologic Depression?: Yes Anxiety?: No  Physical Exam: BP 127/83   Pulse 71   Ht 5\' 6"  (1.676 m)   Wt 162 lb 6.4 oz (73.7 kg)   BMI 26.21 kg/m   Constitutional:  Alert and oriented, No acute distress. HEENT: [place] AT, moist mucus membranes.  Trachea midline, no masses. Cardiovascular: No clubbing, cyanosis, or edema. Respiratory: Normal respiratory effort, no increased work of breathing. GI: Abdomen is soft, nontender, nondistended, no abdominal masses GU: No CVA tenderness.  Skin: No rashes, bruises or suspicious lesions. Lymph: No cervical or inguinal adenopathy. Neurologic: Grossly intact, no focal deficits, moving all 4 extremities. Psychiatric: Normal mood and affect.  Laboratory Data: Lab Results  Component Value Date   WBC 10.0 10/03/2015   HGB 16.2 10/03/2015   HCT 46.8 10/03/2015   MCV 82.6 10/03/2015   PLT 188 10/03/2015    Lab Results  Component Value Date   CREATININE 1.16 10/03/2015    No results found for: PSA  No results found for: TESTOSTERONE  No results found for: HGBA1C  Urinalysis    Component Value Date/Time   COLORURINE YELLOW (A) 10/03/2015 1650   APPEARANCEUR CLEAR (A) 10/03/2015 1650   APPEARANCEUR Clear 09/23/2015 0825   LABSPEC 1.014 10/03/2015 1650   LABSPEC 1.009  08/28/2013 1426   PHURINE 6.0 10/03/2015 1650   GLUCOSEU NEGATIVE 10/03/2015 1650   GLUCOSEU Negative 08/28/2013 1426   HGBUR 1+ (A) 10/03/2015 1650   BILIRUBINUR NEGATIVE 10/03/2015 1650   BILIRUBINUR Negative 09/23/2015 0825   BILIRUBINUR Negative 08/28/2013 1426   KETONESUR NEGATIVE 10/03/2015 1650   PROTEINUR NEGATIVE 10/03/2015 1650   NITRITE NEGATIVE 10/03/2015 1650   LEUKOCYTESUR NEGATIVE 10/03/2015 1650   LEUKOCYTESUR Negative 09/23/2015 0825   LEUKOCYTESUR Negative 08/28/2013 1426    Pertinent Imaging: Reviewed. Previously noted left UPJ stone is no longer present.  Assessment & Plan:    1. Left ureteral stone No evidence of disease. Patient has passed stone. His flank pain is likely secondary to discomfort that will resolve from his procedure. We will send his stone for stone analysis. We will discuss stone analysis at next visit. Next  2. BPH We'll start the patient on finasteride 5 mg daily. He was unable to tolerate the side effects of Flomax. He is instructed that the medication  may take 6-8 weeks to have full effect.  Return in about 3 months (around 02/22/2016).  Hildred Laser, MD  [facility] [address], [address] [place], [place] [address] [phone]

## 2015-12-02 ENCOUNTER — Ambulatory Visit (INDEPENDENT_AMBULATORY_CARE_PROVIDER_SITE_OTHER): Payer: 59 | Admitting: Podiatry

## 2015-12-02 ENCOUNTER — Encounter: Payer: Self-pay | Admitting: Podiatry

## 2015-12-02 DIAGNOSIS — M722 Plantar fascial fibromatosis: Secondary | ICD-10-CM | POA: Diagnosis not present

## 2015-12-02 DIAGNOSIS — M7672 Peroneal tendinitis, left leg: Secondary | ICD-10-CM

## 2015-12-02 MED ORDER — NAPROXEN 500 MG PO TABS: 500.0000 mg | ORAL_TABLET | Freq: Two times a day (BID) | ORAL | 3 refills | Status: DC

## 2015-12-02 MED ORDER — OXYCODONE-ACETAMINOPHEN 10-325 MG PO TABS: 1.0000 | ORAL_TABLET | Freq: Four times a day (QID) | ORAL | 0 refills | Status: DC | PRN

## 2015-12-02 NOTE — Progress Notes (Signed)
He presents today complaining of pain to the left heel and the peroneal tendons. He states that he cannot get any relief that they're chronically painful.  Objective: Vital signs are stable he's alert and oriented 3. Pulses are palpable. Previous MRIs do demonstrate peroneal tendinitis and plantar fasciitis left foot. He still has pain on palpation of the peroneal tubercle of the left foot. And of the plantar medial calcaneal tubercle of the left heel.  Assessment: Peroneal tendinitis left plantar fasciitis left.  Plan: He is requesting injections today. I provided him with Kenalog injections to the peroneal tendon area as well as the plantar fascia area of the left foot. Also injected his right heel for him today as well. I will follow-up with him in 2 weeks at which time he should know when he will be able to have surgery and we will perform a surgical consult regarding an endoscopic plantar fasciotomy left foot and a peroneal tendon repair with cast.

## 2015-12-05 ENCOUNTER — Other Ambulatory Visit: Payer: Self-pay | Admitting: Urology

## 2015-12-18 ENCOUNTER — Ambulatory Visit: Payer: 59 | Admitting: Podiatry

## 2016-01-29 ENCOUNTER — Other Ambulatory Visit: Payer: Self-pay | Admitting: Podiatry

## 2016-01-29 ENCOUNTER — Telehealth: Payer: Self-pay | Admitting: *Deleted

## 2016-01-29 NOTE — Telephone Encounter (Addendum)
Pt's wife, states pt needs more oxycodone. 01/30/2016-Left message informing that Dr. Al CorpusHyatt would not refill the oxycodone, and pt needed an appt.

## 2016-01-29 NOTE — Telephone Encounter (Signed)
No.  Only saw him once back in October.

## 2016-02-26 ENCOUNTER — Ambulatory Visit: Payer: 59

## 2016-06-25 DIAGNOSIS — Z131 Encounter for screening for diabetes mellitus: Secondary | ICD-10-CM | POA: Diagnosis not present

## 2016-06-25 DIAGNOSIS — Z125 Encounter for screening for malignant neoplasm of prostate: Secondary | ICD-10-CM | POA: Diagnosis not present

## 2016-06-25 DIAGNOSIS — I10 Essential (primary) hypertension: Secondary | ICD-10-CM | POA: Diagnosis not present

## 2016-06-25 DIAGNOSIS — R300 Dysuria: Secondary | ICD-10-CM | POA: Diagnosis not present

## 2016-07-08 DIAGNOSIS — N401 Enlarged prostate with lower urinary tract symptoms: Secondary | ICD-10-CM | POA: Diagnosis not present

## 2016-07-08 DIAGNOSIS — R300 Dysuria: Secondary | ICD-10-CM | POA: Diagnosis not present

## 2016-07-08 DIAGNOSIS — K219 Gastro-esophageal reflux disease without esophagitis: Secondary | ICD-10-CM | POA: Diagnosis not present

## 2016-08-10 DIAGNOSIS — L738 Other specified follicular disorders: Secondary | ICD-10-CM | POA: Diagnosis not present

## 2016-08-10 DIAGNOSIS — Z1389 Encounter for screening for other disorder: Secondary | ICD-10-CM | POA: Diagnosis not present

## 2016-09-02 ENCOUNTER — Encounter: Payer: Self-pay | Admitting: Emergency Medicine

## 2016-09-02 ENCOUNTER — Emergency Department: Payer: 59

## 2016-09-02 ENCOUNTER — Emergency Department
Admission: EM | Admit: 2016-09-02 | Discharge: 2016-09-02 | Disposition: A | Discharge status: Home / Self Care | Payer: 59 | Attending: Emergency Medicine | Admitting: Emergency Medicine

## 2016-09-02 DIAGNOSIS — K2900 Acute gastritis without bleeding: Secondary | ICD-10-CM | POA: Diagnosis not present

## 2016-09-02 DIAGNOSIS — K219 Gastro-esophageal reflux disease without esophagitis: Secondary | ICD-10-CM | POA: Insufficient documentation

## 2016-09-02 DIAGNOSIS — I10 Essential (primary) hypertension: Secondary | ICD-10-CM | POA: Insufficient documentation

## 2016-09-02 DIAGNOSIS — Z79899 Other long term (current) drug therapy: Secondary | ICD-10-CM | POA: Insufficient documentation

## 2016-09-02 DIAGNOSIS — R109 Unspecified abdominal pain: Secondary | ICD-10-CM | POA: Diagnosis not present

## 2016-09-02 DIAGNOSIS — R1013 Epigastric pain: Secondary | ICD-10-CM | POA: Insufficient documentation

## 2016-09-02 LAB — CBC
HCT: 45 % (ref 40.0–52.0)
Hemoglobin: 15.3 g/dL (ref 13.0–18.0)
MCH: 28.2 pg (ref 26.0–34.0)
MCHC: 34 g/dL (ref 32.0–36.0)
MCV: 82.9 fL (ref 80.0–100.0)
Platelets: 183 10*3/uL (ref 150–440)
RBC: 5.43 MIL/uL (ref 4.40–5.90)
RDW: 13.8 % (ref 11.5–14.5)
WBC: 4.6 10*3/uL (ref 3.8–10.6)

## 2016-09-02 LAB — URINALYSIS, COMPLETE (UACMP) WITH MICROSCOPIC
Bacteria, UA: NONE SEEN
Bilirubin Urine: NEGATIVE
Glucose, UA: NEGATIVE mg/dL
Hgb urine dipstick: NEGATIVE
Ketones, ur: NEGATIVE mg/dL
Leukocytes, UA: NEGATIVE
Nitrite: NEGATIVE
Protein, ur: NEGATIVE mg/dL
Specific Gravity, Urine: 1.021 (ref 1.005–1.030)
pH: 6 (ref 5.0–8.0)

## 2016-09-02 LAB — COMPREHENSIVE METABOLIC PANEL
ALT: 14 U/L — ABNORMAL LOW (ref 17–63)
AST: 18 U/L (ref 15–41)
Albumin: 4.1 g/dL (ref 3.5–5.0)
Alkaline Phosphatase: 44 U/L (ref 38–126)
Anion gap: 7 (ref 5–15)
BUN: 15 mg/dL (ref 6–20)
CO2: 26 mmol/L (ref 22–32)
Calcium: 9.1 mg/dL (ref 8.9–10.3)
Chloride: 106 mmol/L (ref 101–111)
Creatinine, Ser: 0.76 mg/dL (ref 0.61–1.24)
GFR calc Af Amer: >60 mL/min (ref >60)
GFR calc non Af Amer: >60 mL/min (ref >60)
Glucose, Bld: 98 mg/dL (ref 65–99)
Potassium: 3.6 mmol/L (ref 3.5–5.1)
Sodium: 139 mmol/L (ref 135–145)
Total Bilirubin: 0.9 mg/dL (ref 0.3–1.2)
Total Protein: 6.9 g/dL (ref 6.5–8.1)

## 2016-09-02 LAB — TROPONIN I: Troponin I: <0.03 ng/mL (ref <0.03)

## 2016-09-02 LAB — LIPASE, BLOOD: Lipase: 27 U/L (ref 11–51)

## 2016-09-02 MED ORDER — HYDROMORPHONE HCL 1 MG/ML IJ SOLN
1.0000 mg | Freq: Once | INTRAMUSCULAR | Status: AC
  Administered 2016-09-02: 1 mg via INTRAVENOUS

## 2016-09-02 MED ORDER — ONDANSETRON 4 MG PO TBDP: 4.0000 mg | ORAL_TABLET | Freq: Three times a day (TID) | ORAL | 0 refills | Status: DC | PRN

## 2016-09-02 MED ORDER — HYDROMORPHONE HCL 1 MG/ML IJ SOLN
INTRAMUSCULAR | Status: AC
  Administered 2016-09-02: 1 mg via INTRAVENOUS
  Filled 2016-09-02: qty 1

## 2016-09-02 MED ORDER — METOCLOPRAMIDE HCL 10 MG PO TABS: 10.0000 mg | ORAL_TABLET | Freq: Four times a day (QID) | ORAL | 0 refills | Status: DC | PRN

## 2016-09-02 MED ORDER — SODIUM CHLORIDE 0.9 % IV BOLUS (SEPSIS)
1000.0000 mL | Freq: Once | INTRAVENOUS | Status: AC
  Administered 2016-09-02: 1000 mL via INTRAVENOUS

## 2016-09-02 MED ORDER — GI COCKTAIL ~~LOC~~
30.0000 mL | ORAL | Status: AC
  Administered 2016-09-02: 30 mL via ORAL

## 2016-09-02 MED ORDER — METOCLOPRAMIDE HCL 5 MG/ML IJ SOLN
INTRAMUSCULAR | Status: AC
  Administered 2016-09-02: 10 mg via INTRAVENOUS
  Filled 2016-09-02: qty 2

## 2016-09-02 MED ORDER — GI COCKTAIL ~~LOC~~
ORAL | Status: AC
  Administered 2016-09-02: 30 mL via ORAL
  Filled 2016-09-02: qty 30

## 2016-09-02 MED ORDER — FAMOTIDINE 20 MG PO TABS
20.0000 mg | ORAL_TABLET | Freq: Two times a day (BID) | ORAL | 0 refills | Status: DC
Start: 2016-09-02 — End: 2017-11-17

## 2016-09-02 MED ORDER — FAMOTIDINE 20 MG PO TABS
ORAL_TABLET | ORAL | Status: AC
  Administered 2016-09-02: 40 mg via ORAL
  Filled 2016-09-02: qty 2

## 2016-09-02 MED ORDER — FAMOTIDINE 20 MG PO TABS
40.0000 mg | ORAL_TABLET | Freq: Once | ORAL | Status: AC
  Administered 2016-09-02: 40 mg via ORAL

## 2016-09-02 MED ORDER — IOPAMIDOL (ISOVUE-300) INJECTION 61%
100.0000 mL | Freq: Once | INTRAVENOUS | Status: AC | PRN
  Administered 2016-09-02: 100 mL via INTRAVENOUS
  Filled 2016-09-02: qty 100

## 2016-09-02 MED ORDER — METOCLOPRAMIDE HCL 5 MG/ML IJ SOLN
10.0000 mg | Freq: Once | INTRAMUSCULAR | Status: AC
  Administered 2016-09-02: 10 mg via INTRAVENOUS

## 2016-09-02 MED ORDER — SUCRALFATE 1 G PO TABS: 1.0000 g | ORAL_TABLET | Freq: Four times a day (QID) | ORAL | 1 refills | Status: DC

## 2016-09-02 MED ORDER — IOPAMIDOL (ISOVUE-300) INJECTION 61%
30.0000 mL | Freq: Once | INTRAVENOUS | Status: AC | PRN
  Administered 2016-09-02: 30 mL via ORAL
  Filled 2016-09-02: qty 30

## 2016-09-02 NOTE — ED Notes (Signed)
Pt taken to CT.

## 2016-09-02 NOTE — ED Notes (Signed)
Patient finished drinking contrast.  Called CT.

## 2016-09-02 NOTE — ED Notes (Signed)
Pt came out of room asking for pain medicine. Informed that the doctor has been in rooms assessing pts and this RN will ask when he comes out of the room.

## 2016-09-02 NOTE — ED Provider Notes (Signed)
[facility] Emergency Department Provider Note  ____________________________________________  Time seen: Approximately 7:19 PM  I have reviewed the triage vital signs and the nursing notes.   HISTORY  Chief Complaint Abdominal Pain  Encounter completed with assistance from bedside Spanish interpreter  HPI Allen Chapman is a 56 y.o. male who complains of epigastric pain for the past 2 weeks, initially was waxing and waning but now for the past 4 or 5 days has been constant and severe. No vomiting. Radiates to the back. Worse with eating, no alleviating factors. No diarrhea or constipation.     Past Medical History:  Diagnosis Date  . Erectile dysfunction 07/02/2014  . GERD (gastroesophageal reflux disease)   . Hematuria, microscopic 07/02/2014  . Hypogonadism in male 07/02/2014  . Inguinal hernia, left 07/02/2014  . Kidney stone   . Prostate enlargement      Patient Active Problem List   Diagnosis Date Noted  . HTN (hypertension) 09/10/2015  . Plantar fasciitis of left foot 09/10/2015  . Chest wall pain 04/03/2015  . Gastritis and gastroduodenitis 04/03/2015  . Insomnia 04/03/2015  . Snoring 04/03/2015  . Chronic insomnia 10/31/2014  . Elevated BP 10/31/2014  . Gastroesophageal reflux disease without esophagitis 10/31/2014  . Erectile dysfunction 07/02/2014  . Hypogonadism in male 07/02/2014  . Difficult or painful urination 05/19/2014  . Enlarged prostate 05/19/2014  . ED (erectile dysfunction) of organic origin 05/19/2014  . 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 Hernia, inguinal 05/19/2014  . Hematuria, microscopic 05/19/2014  . Prostatitis 05/19/2014  . Calculus of kidney 05/19/2014  . Diarrhea 01/17/2014  . Excessive urination at night 07/01/2012  . Must strain to pass urine 07/01/2012  . Urgency of micturation 07/01/2012  . FOM (frequency of micturition) 07/01/2012     Past Surgical History:  Procedure Laterality Date  . APPENDECTOMY   1980  . CYSTOSCOPY  03/31/2014  . EXTRACORPOREAL SHOCK WAVE LITHOTRIPSY Left 10/10/2015   Procedure: EXTRACORPOREAL SHOCK WAVE LITHOTRIPSY (ESWL);  Surgeon: Hildred LaserBrian James Budzyn, MD;  Location: [facility] ORS;  Service: Urology;  Laterality: Left;  . LITHOTRIPSY       Prior to Admission medications   Medication Sig Start Date End Date Taking? Authorizing Provider  docusate sodium (COLACE) 100 MG capsule Take 1 capsule (100 mg total) by mouth daily as needed. Patient not taking: Reported on 11/22/2015 10/03/15 10/02/16  Jeanmarie PlantMcShane, James A, MD  famotidine (PEPCID) 20 MG tablet Take 1 tablet (20 mg total) by mouth 2 (two) times daily. 09/02/16   Sharman CheekStafford, Greyson Peavy, MD  finasteride (PROSCAR) 5 MG tablet Take 1 tablet (5 mg total) by mouth daily. 11/22/15   Hildred LaserBudzyn, Brian James, MD  metoCLOPramide (REGLAN) 10 MG tablet Take 1 tablet (10 mg total) by mouth every 6 (six) hours as needed. 09/02/16   Sharman CheekStafford, Eleuterio Dollar, MD  omeprazole (PRILOSEC) 40 MG capsule Take 40 mg by mouth daily.    [provider]  ondansetron (ZOFRAN ODT) 4 MG disintegrating tablet Take 1 tablet (4 mg total) by mouth every 8 (eight) hours as needed for nausea or vomiting. 09/02/16   Sharman CheekStafford, Braun Rocca, MD  ondansetron (ZOFRAN) 4 MG tablet Take 1 tablet (4 mg total) by mouth every 8 (eight) hours as needed for nausea or vomiting. Patient not taking: Reported on 11/22/2015 10/03/15   Jeanmarie PlantMcShane, James A, MD  sucralfate (CARAFATE) 1 g tablet Take 1 tablet (1 g total) by mouth 4 (four) times daily. 09/02/16   Sharman CheekStafford, Kimia Finan, MD  temazepam (RESTORIL) 15 MG capsule Take 15  mg by mouth at bedtime. 07/21/15   [provider]  valACYclovir (VALTREX) 1000 MG tablet Take 1 tablet by mouth 3 (three) times daily. For 7 days 09/25/15   [provider]     Allergies Nortriptyline and Flomax  [tamsulosin hcl]   Family History  Problem Relation Age of Onset  . Bladder Cancer Neg Hx   . Kidney cancer Neg Hx   . Prostate cancer Neg Hx      Social History Social History  Substance Use Topics  . Smoking status: Never Smoker  . Smokeless tobacco: Never Used  . Alcohol use No    Review of Systems  Constitutional:   No fever or chills.  ENT:   No sore throat. No rhinorrhea. Cardiovascular:   No chest pain or syncope. Respiratory:   No dyspnea or cough. Gastrointestinal:   Positive as above for epigastric pain without vomiting and diarrhea.  Musculoskeletal:   Negative for focal pain or swelling All other systems reviewed and are negative except as documented above in ROS and HPI.  ____________________________________________   PHYSICAL EXAM:  VITAL SIGNS: ED Triage Vitals  Enc Vitals Group     BP 09/02/16 1546 114/76     Pulse Rate 09/02/16 1546 74     Resp 09/02/16 1546 16     Temp --      Temp src --      SpO2 09/02/16 1546 97 %     Weight 09/02/16 1546 160 lb (72.6 kg)     Height --      Head Circumference --      Peak Flow --      Pain Score 09/02/16 1545 10     Pain Loc --      Pain Edu? --      Excl. in GC? --     Vital signs reviewed, nursing assessments reviewed.   Constitutional:   Alert and oriented. Well appearing and in no distress. Eyes:   No scleral icterus.  EOMI. No nystagmus. No conjunctival pallor. PERRL. ENT   Head:   Normocephalic and atraumatic.   Nose:   No congestion/rhinnorhea.    Mouth/Throat:   MMM, no pharyngeal erythema. No peritonsillar mass.    Neck:   No meningismus. Full ROM Hematological/Lymphatic/Immunilogical:   No cervical lymphadenopathy. Cardiovascular:   RRR. Symmetric bilateral radial and DP pulses.  No murmurs.  Respiratory:   Normal respiratory effort without tachypnea/retractions. Breath sounds are clear and equal bilaterally. No wheezes/rales/rhonchi. Gastrointestinal:   Soft With epigastric tenderness. No right upper quadrant tenderness, negative Murphy sign. Non distended. There is no CVA tenderness.  No rebound, rigidity, or  guarding. Genitourinary:   deferred Musculoskeletal:   Normal range of motion in all extremities. No joint effusions.  No lower extremity tenderness.  No edema. Neurologic:   Normal speech and language.  Motor grossly intact. No gross focal neurologic deficits are appreciated.  Skin:    Skin is warm, dry and intact. No rash noted.  No petechiae, purpura, or bullae.  ____________________________________________    LABS (pertinent positives/negatives) (all labs ordered are listed, but only abnormal results are displayed) Labs Reviewed  COMPREHENSIVE METABOLIC PANEL - Abnormal; Notable for the following:       Result Value   ALT 14 (*)    All other components within normal limits  URINALYSIS, COMPLETE (UACMP) WITH MICROSCOPIC - Abnormal; Notable for the following:    Color, Urine YELLOW (*)    APPearance CLEAR (*)  Squamous Epithelial / LPF 0-5 (*)    All other components within normal limits  LIPASE, BLOOD  CBC  TROPONIN I   ____________________________________________   EKG  Interpreted by me  Date: 09/02/2016  Rate: 67  Rhythm: normal sinus rhythm  QRS Axis: normal  Intervals: normal  ST/T Wave abnormalities: normal  Conduction Disutrbances: none  Narrative Interpretation: unremarkable      ____________________________________________    RADIOLOGY  Ct Abdomen Pelvis W Contrast  Result Date: 09/02/2016 CLINICAL DATA:  Severe epigastric pain EXAM: CT ABDOMEN AND PELVIS WITH CONTRAST TECHNIQUE: Multidetector CT imaging of the abdomen and pelvis was performed using the standard protocol following bolus administration of intravenous contrast. CONTRAST:  ISOVUE-300 IOPAMIDOL (ISOVUE-300) INJECTION 61% COMPARISON:  CT abdomen pelvis 10/04/2015 FINDINGS: Lower chest: Mild bibasilar scarring or atelectasis. No infiltrate or effusion. Hepatobiliary: Normal liver, gallbladder, and bile ducts. Pancreas: Negative Spleen: Negative Adrenals/Urinary Tract: Bilateral  renal cysts. Largest cyst on the right measures 6.7 cm unchanged from the prior study. No renal obstruction or stone. Urinary bladder normal. Stomach/Bowel: Negative stomach and small bowel. Negative for bowel obstruction. Appendectomy. Mild diverticulosis in the colon without diverticulitis. Negative for bowel mass or edema. Vascular/Lymphatic: Negative Reproductive: Prostate enlargement. Other: No free fluid.  Negative for hernia Musculoskeletal: Negative IMPRESSION: No cause for acute abdominal pain identified. Electronically Signed   By: Marlan Palau M.D.   On: 09/02/2016 19:31    ____________________________________________   PROCEDURES Procedures  ____________________________________________   INITIAL IMPRESSION / ASSESSMENT AND PLAN / ED COURSE  Pertinent labs & imaging results that were available during my care of the patient were reviewed by me and considered in my medical decision making (see chart for details).  Patient uncomfortable but not in distress, presents with epigastric pain and tenderness. Due to the chronicity, worsening pattern, and tenderness on exam, we'll get a CT scan. His labs are reassuring and his vital signs are normal, so a CT does not reveal any significant pathology, he should be suitable for discharge home with continued antacid therapy.,   ----------------------------------------- 8:25 PM on 09/02/2016 -----------------------------------------  Patient updated on results. Vital signs remained stable. Opioid did not help with his pain, will give antacids. I think this is gastritis, he should follow up with primary care and gastroenterology.Considering the patient's symptoms, medical history, and physical examination today, I have low suspicion for cholecystitis or biliary pathology, pancreatitis, perforation or bowel obstruction, hernia, intra-abdominal abscess, AAA or dissection, volvulus or intussusception, mesenteric ischemia, or appendicitis.        ____________________________________________   FINAL CLINICAL IMPRESSION(S) / ED DIAGNOSES  Final diagnoses:  Epigastric pain  Acute gastritis without hemorrhage, unspecified gastritis type      New Prescriptions   FAMOTIDINE (PEPCID) 20 MG TABLET    Take 1 tablet (20 mg total) by mouth 2 (two) times daily.   METOCLOPRAMIDE (REGLAN) 10 MG TABLET    Take 1 tablet (10 mg total) by mouth every 6 (six) hours as needed.   ONDANSETRON (ZOFRAN ODT) 4 MG DISINTEGRATING TABLET    Take 1 tablet (4 mg total) by mouth every 8 (eight) hours as needed for nausea or vomiting.   SUCRALFATE (CARAFATE) 1 G TABLET    Take 1 tablet (1 g total) by mouth 4 (four) times daily.     Portions of this note were generated with dragon dictation software. Dictation errors may occur despite best attempts at proofreading.    Sharman Cheek, MD 09/02/16 2026

## 2016-09-02 NOTE — ED Notes (Signed)
Patient transported to CT 

## 2016-09-02 NOTE — Discharge Instructions (Signed)
Results for orders placed or performed during the hospital encounter of 09/02/16  Lipase, blood  Result Value Ref Range   Lipase 27 11 - 51 U/L  Comprehensive metabolic panel  Result Value Ref Range   Sodium 139 135 - 145 mmol/L   Potassium 3.6 3.5 - 5.1 mmol/L   Chloride 106 101 - 111 mmol/L   CO2 26 22 - 32 mmol/L   Glucose, Bld 98 65 - 99 mg/dL   BUN 15 6 - 20 mg/dL   Creatinine, Ser 9.600.76 0.61 - 1.24 mg/dL   Calcium 9.1 8.9 - 45.410.3 mg/dL   Total Protein 6.9 6.5 - 8.1 g/dL   Albumin 4.1 3.5 - 5.0 g/dL   AST 18 15 - 41 U/L   ALT 14 (L) 17 - 63 U/L   Alkaline Phosphatase 44 38 - 126 U/L   Total Bilirubin 0.9 0.3 - 1.2 mg/dL   GFR calc non Af Amer >60 >60 mL/min   GFR calc Af Amer >60 >60 mL/min   Anion gap 7 5 - 15  CBC  Result Value Ref Range   WBC 4.6 3.8 - 10.6 K/uL   RBC 5.43 4.40 - 5.90 MIL/uL   Hemoglobin 15.3 13.0 - 18.0 g/dL   HCT 09.845.0 11.940.0 - 14.752.0 %   MCV 82.9 80.0 - 100.0 fL   MCH 28.2 26.0 - 34.0 pg   MCHC 34.0 32.0 - 36.0 g/dL   RDW 82.913.8 56.211.5 - 13.014.5 %   Platelets 183 150 - 440 K/uL  Urinalysis, Complete w Microscopic  Result Value Ref Range   Color, Urine YELLOW (A) YELLOW   APPearance CLEAR (A) CLEAR   Specific Gravity, Urine 1.021 1.005 - 1.030   pH 6.0 5.0 - 8.0   Glucose, UA NEGATIVE NEGATIVE mg/dL   Hgb urine dipstick NEGATIVE NEGATIVE   Bilirubin Urine NEGATIVE NEGATIVE   Ketones, ur NEGATIVE NEGATIVE mg/dL   Protein, ur NEGATIVE NEGATIVE mg/dL   Nitrite NEGATIVE NEGATIVE   Leukocytes, UA NEGATIVE NEGATIVE   RBC / HPF 0-5 0 - 5 RBC/hpf   WBC, UA 0-5 0 - 5 WBC/hpf   Bacteria, UA NONE SEEN NONE SEEN   Squamous Epithelial / LPF 0-5 (A) NONE SEEN   Mucous PRESENT    Hyaline Casts, UA PRESENT   Troponin I  Result Value Ref Range   Troponin I <0.03 <0.03 ng/mL   Ct Abdomen Pelvis W Contrast  Result Date: 09/02/2016 CLINICAL DATA:  Severe epigastric pain EXAM: CT ABDOMEN AND PELVIS WITH CONTRAST TECHNIQUE: Multidetector CT imaging of the abdomen and  pelvis was performed using the standard protocol following bolus administration of intravenous contrast. CONTRAST:  100mL ISOVUE-300 IOPAMIDOL (ISOVUE-300) INJECTION 61% COMPARISON:  CT abdomen pelvis 10/04/2015 FINDINGS: Lower chest: Mild bibasilar scarring or atelectasis. No infiltrate or effusion. Hepatobiliary: Normal liver, gallbladder, and bile ducts. Pancreas: Negative Spleen: Negative Adrenals/Urinary Tract: Bilateral renal cysts. Largest cyst on the right measures 6.7 cm unchanged from the prior study. No renal obstruction or stone. Urinary bladder normal. Stomach/Bowel: Negative stomach and small bowel. Negative for bowel obstruction. Appendectomy. Mild diverticulosis in the colon without diverticulitis. Negative for bowel mass or edema. Vascular/Lymphatic: Negative Reproductive: Prostate enlargement. Other: No free fluid.  Negative for hernia Musculoskeletal: Negative IMPRESSION: No cause for acute abdominal pain identified. Electronically Signed   By: Marlan Palauharles  Clark M.D.   On: 09/02/2016 19:31

## 2016-09-02 NOTE — ED Notes (Signed)
[facility] for about 17 days  Hx of GERD and has tried prilosec and maalox  With min relief

## 2016-09-02 NOTE — ED Triage Notes (Signed)
Pt c/o epigastric pain X 2 weeks.  No vomiting or SHOB.  Has taken omeprazole but pain has not improved. Worse after eating.  No fevers.  Skin warm and dry. Respirations unlabored.

## 2016-09-02 NOTE — ED Notes (Signed)
Interpreter requested 

## 2016-10-21 DIAGNOSIS — R1013 Epigastric pain: Secondary | ICD-10-CM | POA: Diagnosis not present

## 2016-10-21 DIAGNOSIS — Z5320 Procedure and treatment not carried out because of patient's decision for unspecified reasons: Secondary | ICD-10-CM | POA: Diagnosis not present

## 2016-10-28 DIAGNOSIS — Z23 Encounter for immunization: Secondary | ICD-10-CM | POA: Diagnosis not present

## 2016-11-10 DIAGNOSIS — R21 Rash and other nonspecific skin eruption: Secondary | ICD-10-CM | POA: Diagnosis not present

## 2016-12-23 DIAGNOSIS — N401 Enlarged prostate with lower urinary tract symptoms: Secondary | ICD-10-CM | POA: Diagnosis not present

## 2016-12-23 DIAGNOSIS — R1032 Left lower quadrant pain: Secondary | ICD-10-CM | POA: Diagnosis not present

## 2016-12-23 DIAGNOSIS — L304 Erythema intertrigo: Secondary | ICD-10-CM | POA: Diagnosis not present

## 2016-12-29 ENCOUNTER — Other Ambulatory Visit: Payer: Self-pay | Admitting: Internal Medicine

## 2016-12-29 DIAGNOSIS — R1032 Left lower quadrant pain: Secondary | ICD-10-CM

## 2017-01-01 ENCOUNTER — Ambulatory Visit
Admission: RE | Admit: 2017-01-01 | Discharge: 2017-01-01 | Disposition: A | Discharge status: Home / Self Care | Payer: 59 | Source: Ambulatory Visit | Attending: Internal Medicine | Admitting: Internal Medicine

## 2017-01-01 DIAGNOSIS — N433 Hydrocele, unspecified: Secondary | ICD-10-CM | POA: Insufficient documentation

## 2017-01-01 DIAGNOSIS — N5082 Scrotal pain: Secondary | ICD-10-CM | POA: Insufficient documentation

## 2017-01-01 DIAGNOSIS — R1032 Left lower quadrant pain: Secondary | ICD-10-CM

## 2017-01-06 ENCOUNTER — Other Ambulatory Visit: Payer: Self-pay | Admitting: Internal Medicine

## 2017-01-06 DIAGNOSIS — N5082 Scrotal pain: Secondary | ICD-10-CM

## 2017-01-26 DIAGNOSIS — A048 Other specified bacterial intestinal infections: Secondary | ICD-10-CM | POA: Diagnosis not present

## 2017-05-15 ENCOUNTER — Emergency Department
Admission: EM | Admit: 2017-05-15 | Discharge: 2017-05-15 | Disposition: A | Discharge status: Home / Self Care | Payer: 59 | Attending: Emergency Medicine | Admitting: Emergency Medicine

## 2017-05-15 ENCOUNTER — Other Ambulatory Visit: Payer: Self-pay

## 2017-05-15 ENCOUNTER — Emergency Department: Payer: 59

## 2017-05-15 ENCOUNTER — Encounter: Payer: Self-pay | Admitting: Emergency Medicine

## 2017-05-15 DIAGNOSIS — H8120 Vestibular neuronitis, unspecified ear: Secondary | ICD-10-CM | POA: Diagnosis not present

## 2017-05-15 DIAGNOSIS — Z79899 Other long term (current) drug therapy: Secondary | ICD-10-CM | POA: Diagnosis not present

## 2017-05-15 DIAGNOSIS — J111 Influenza due to unidentified influenza virus with other respiratory manifestations: Secondary | ICD-10-CM | POA: Insufficient documentation

## 2017-05-15 DIAGNOSIS — I10 Essential (primary) hypertension: Secondary | ICD-10-CM | POA: Diagnosis not present

## 2017-05-15 DIAGNOSIS — R51 Headache: Secondary | ICD-10-CM | POA: Diagnosis present

## 2017-05-15 DIAGNOSIS — R69 Illness, unspecified: Secondary | ICD-10-CM

## 2017-05-15 LAB — URINALYSIS, COMPLETE (UACMP) WITH MICROSCOPIC
Bacteria, UA: NONE SEEN
Bilirubin Urine: NEGATIVE
Glucose, UA: NEGATIVE mg/dL
Hgb urine dipstick: NEGATIVE
Ketones, ur: NEGATIVE mg/dL
Leukocytes, UA: NEGATIVE
Nitrite: NEGATIVE
Protein, ur: NEGATIVE mg/dL
Specific Gravity, Urine: 1.021 (ref 1.005–1.030)
pH: 5 (ref 5.0–8.0)

## 2017-05-15 LAB — COMPREHENSIVE METABOLIC PANEL
ALT: 32 U/L (ref 17–63)
AST: 27 U/L (ref 15–41)
Albumin: 3.6 g/dL (ref 3.5–5.0)
Alkaline Phosphatase: 48 U/L (ref 38–126)
Anion gap: 10 (ref 5–15)
BUN: 16 mg/dL (ref 6–20)
CO2: 25 mmol/L (ref 22–32)
Calcium: 8.8 mg/dL — ABNORMAL LOW (ref 8.9–10.3)
Chloride: 107 mmol/L (ref 101–111)
Creatinine, Ser: 0.72 mg/dL (ref 0.61–1.24)
GFR calc Af Amer: >60 mL/min (ref >60)
GFR calc non Af Amer: >60 mL/min (ref >60)
Glucose, Bld: 108 mg/dL — ABNORMAL HIGH (ref 65–99)
Potassium: 3.5 mmol/L (ref 3.5–5.1)
Sodium: 142 mmol/L (ref 135–145)
Total Bilirubin: 0.5 mg/dL (ref 0.3–1.2)
Total Protein: 6.8 g/dL (ref 6.5–8.1)

## 2017-05-15 LAB — CBC
HCT: 43.8 % (ref 40.0–52.0)
Hemoglobin: 14.7 g/dL (ref 13.0–18.0)
MCH: 27.7 pg (ref 26.0–34.0)
MCHC: 33.5 g/dL (ref 32.0–36.0)
MCV: 82.9 fL (ref 80.0–100.0)
Platelets: 199 10*3/uL (ref 150–440)
RBC: 5.28 MIL/uL (ref 4.40–5.90)
RDW: 14.1 % (ref 11.5–14.5)
WBC: 11.1 10*3/uL — ABNORMAL HIGH (ref 3.8–10.6)

## 2017-05-15 LAB — TROPONIN I: Troponin I: <0.03 ng/mL (ref <0.03)

## 2017-05-15 LAB — INFLUENZA PANEL BY PCR (TYPE A & B)
Influenza A By PCR: NEGATIVE
Influenza B By PCR: NEGATIVE

## 2017-05-15 LAB — LIPASE, BLOOD: Lipase: 25 U/L (ref 11–51)

## 2017-05-15 MED ORDER — SODIUM CHLORIDE 0.9 % IV BOLUS
1000.0000 mL | Freq: Once | INTRAVENOUS | Status: AC
  Administered 2017-05-15: 1000 mL via INTRAVENOUS

## 2017-05-15 MED ORDER — ACETAMINOPHEN 325 MG PO TABS
650.0000 mg | ORAL_TABLET | Freq: Once | ORAL | Status: AC
  Administered 2017-05-15: 650 mg via ORAL
  Filled 2017-05-15: qty 2

## 2017-05-15 MED ORDER — ONDANSETRON 4 MG PO TBDP
ORAL_TABLET | ORAL | Status: AC
  Administered 2017-05-15: 4 mg via ORAL
  Filled 2017-05-15: qty 1

## 2017-05-15 MED ORDER — MECLIZINE HCL 25 MG PO TABS: 25.0000 mg | ORAL_TABLET | Freq: Three times a day (TID) | ORAL | 0 refills | Status: DC | PRN

## 2017-05-15 MED ORDER — ONDANSETRON 4 MG PO TBDP
4.0000 mg | ORAL_TABLET | Freq: Once | ORAL | Status: AC
  Administered 2017-05-15: 4 mg via ORAL

## 2017-05-15 MED ORDER — IPRATROPIUM-ALBUTEROL 0.5-2.5 (3) MG/3ML IN SOLN
3.0000 mL | Freq: Once | RESPIRATORY_TRACT | Status: AC
  Administered 2017-05-15: 3 mL via RESPIRATORY_TRACT
  Filled 2017-05-15: qty 3

## 2017-05-15 MED ORDER — ONDANSETRON HCL 4 MG/2ML IJ SOLN
4.0000 mg | Freq: Once | INTRAMUSCULAR | Status: AC
  Administered 2017-05-15: 4 mg via INTRAVENOUS
  Filled 2017-05-15: qty 2

## 2017-05-15 MED ORDER — MECLIZINE HCL 25 MG PO TABS
25.0000 mg | ORAL_TABLET | Freq: Once | ORAL | Status: AC
  Administered 2017-05-15: 25 mg via ORAL
  Filled 2017-05-15 (×2): qty 1

## 2017-05-15 MED ORDER — PREDNISONE 50 MG PO TABS: ORAL_TABLET | ORAL | 0 refills | Status: DC

## 2017-05-15 MED ORDER — PREDNISONE 20 MG PO TABS
60.0000 mg | ORAL_TABLET | Freq: Once | ORAL | Status: AC
  Administered 2017-05-15: 60 mg via ORAL
  Filled 2017-05-15: qty 3

## 2017-05-15 NOTE — ED Provider Notes (Signed)
[facility] Emergency Department Provider Note  ____________________________________________  Time seen: Approximately 1:53 PM  I have reviewed the triage vital signs and the nursing notes.   HISTORY  Chief Complaint Cough    HPI Allen Chapman is a 57 y.o. male that presents to the emergency department for evaluation of chills, headache, body aches, nasal congestion, productive cough with yellow sputum, weakness for 1.5 weeks. He has SOB and CP with coughing. This morning he woke up feeling dizzy and like the room is spinning. He does not feel dizzy when he is sitting still but feels dizzy with sudden head movements. He has not taken his temperature but has had chills. He has not had an appetite today. Patient went to urgent care 1.5 weeks ago and was started on Tamiflu and Phenergan.  He went to [place] [facility] 3 days ago and was given prescriptions for Levaquin and a low dose of prednisone. He has not taken any tylenol or ibuprofen.  His wife tested positive for flu but her symptoms have improved this week. He does not smoke. No recent surgery. No vomiting, abdominal pain, diarrhea.   Past Medical History:  Diagnosis Date  . Erectile dysfunction 07/02/2014  . GERD (gastroesophageal reflux disease)   . Hematuria, microscopic 07/02/2014  . Hypogonadism in male 07/02/2014  . Inguinal hernia, left 07/02/2014  . Kidney stone   . Prostate enlargement     Patient Active Problem List   Diagnosis Date Noted  . HTN (hypertension) 09/10/2015  . Plantar fasciitis of left foot 09/10/2015  . Chest wall pain 04/03/2015  . Gastritis and gastroduodenitis 04/03/2015  . Insomnia 04/03/2015  . Snoring 04/03/2015  . Chronic insomnia 10/31/2014  . Elevated BP 10/31/2014  . Gastroesophageal reflux disease without esophagitis 10/31/2014  . Erectile dysfunction 07/02/2014  . Hypogonadism in male 07/02/2014  . Difficult or painful urination 05/19/2014  . Enlarged prostate  05/19/2014  . ED (erectile dysfunction) of organic origin 05/19/2014  . 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 Hernia, inguinal 05/19/2014  . Hematuria, microscopic 05/19/2014  . Prostatitis 05/19/2014  . Calculus of kidney 05/19/2014  . Diarrhea 01/17/2014  . Excessive urination at night 07/01/2012  . Must strain to pass urine 07/01/2012  . Urgency of micturation 07/01/2012  . FOM (frequency of micturition) 07/01/2012    Past Surgical History:  Procedure Laterality Date  . APPENDECTOMY  1980  . CYSTOSCOPY  03/31/2014  . EXTRACORPOREAL SHOCK WAVE LITHOTRIPSY Left 10/10/2015   Procedure: EXTRACORPOREAL SHOCK WAVE LITHOTRIPSY (ESWL);  Surgeon: Hildred Laser, MD;  Location: [facility] ORS;  Service: Urology;  Laterality: Left;  . LITHOTRIPSY      Prior to Admission medications   Medication Sig Start Date End Date Taking? Authorizing Provider  famotidine (PEPCID) 20 MG tablet Take 1 tablet (20 mg total) by mouth 2 (two) times daily. 09/02/16   Sharman Cheek, MD  finasteride (PROSCAR) 5 MG tablet Take 1 tablet (5 mg total) by mouth daily. 11/22/15   Hildred Laser, MD  meclizine (ANTIVERT) 25 MG tablet Take 1 tablet (25 mg total) by mouth 3 (three) times daily as needed for dizziness. 05/15/17   Enid Derry, PA-C  metoCLOPramide (REGLAN) 10 MG tablet Take 1 tablet (10 mg total) by mouth every 6 (six) hours as needed. 09/02/16   Sharman Cheek, MD  omeprazole (PRILOSEC) 40 MG capsule Take 40 mg by mouth daily.    [provider]  ondansetron (ZOFRAN ODT) 4 MG disintegrating tablet Take 1 tablet (4 mg total)  by mouth every 8 (eight) hours as needed for nausea or vomiting. 09/02/16   Sharman CheekStafford, Phillip, MD  ondansetron (ZOFRAN) 4 MG tablet Take 1 tablet (4 mg total) by mouth every 8 (eight) hours as needed for nausea or vomiting. Patient not taking: Reported on 11/22/2015 10/03/15   Jeanmarie PlantMcShane, James A, MD  predniSONE (DELTASONE) 50 MG tablet Take 1 tab per day 05/15/17   Enid DerryWagner, Eoin Willden, PA-C   sucralfate (CARAFATE) 1 g tablet Take 1 tablet (1 g total) by mouth 4 (four) times daily. 09/02/16   Sharman CheekStafford, Phillip, MD  temazepam (RESTORIL) 15 MG capsule Take 15 mg by mouth at bedtime. 07/21/15   [provider]  valACYclovir (VALTREX) 1000 MG tablet Take 1 tablet by mouth 3 (three) times daily. For 7 days 09/25/15   [provider]    Allergies Nortriptyline and Flomax  [tamsulosin hcl]  Family History  Problem Relation Age of Onset  . Bladder Cancer Neg Hx   . Kidney cancer Neg Hx   . Prostate cancer Neg Hx     Social History Social History   Tobacco Use  . Smoking status: Never Smoker  . Smokeless tobacco: Never Used  Substance Use Topics  . Alcohol use: No    Alcohol/week: 0.0 oz  . Drug use: No     Review of Systems  Constitutional: Positive for chills Eyes: No visual changes.  ENT: Positive for congestion and rhinorrhea. Respiratory: Positive for cough.  Gastrointestinal: No abdominal pain.  No vomiting.  No diarrhea.  No constipation. Musculoskeletal: Positive for body aches Skin: Negative for rash, abrasions, lacerations, ecchymosis. Neurological: Positive for headaches.   ____________________________________________   PHYSICAL EXAM:  VITAL SIGNS: ED Triage Vitals  Enc Vitals Group     BP 05/15/17 1332 (!) 186/83     Pulse Rate 05/15/17 1332 (!) 102     Resp 05/15/17 1332 20     Temp 05/15/17 1332 98.6 F (37 C)     Temp Source 05/15/17 1332 Oral     SpO2 05/15/17 1332 96 %     Weight 05/15/17 1334 172 lb (78 kg)     Height 05/15/17 1334 5\' 6"  (1.676 m)     Head Circumference --      Peak Flow --      Pain Score 05/15/17 1333 8     Pain Loc --      Pain Edu? --      Excl. in GC? --      Constitutional: Alert and oriented. Well appearing and in no acute distress. Eyes: Conjunctivae are normal. PERRL. EOMI. No discharge.  Head: Atraumatic. ENT: No frontal and maxillary sinus tenderness.      Ears: Tympanic membranes  pearly gray with good landmarks. No discharge.      Nose: Mild congestion/rhinnorhea.      Mouth/Throat: Mucous membranes are moist. Oropharynx non-erythematous. Tonsils not enlarged. No exudates. Uvula midline. Neck: No stridor.   Cardiovascular: Normal rate, regular rhythm.  Good peripheral circulation. Respiratory: Normal respiratory effort without tachypnea or retractions. Lungs CTAB. Good air entry to the bases with no decreased or absent breath sounds. Gastrointestinal: Bowel sounds 4 quadrants. Soft and nontender to palpation. No guarding or rigidity. No palpable masses. No distention. Musculoskeletal: Full range of motion to all extremities. No gross deformities appreciated. Neurologic:  Normal speech and language. No gross focal neurologic deficits are appreciated.  Skin:  Skin is warm, dry and intact. No rash noted.   ____________________________________________   Vickie EpleyLABS (  all labs ordered are listed, but only abnormal results are displayed)  Labs Reviewed  CBC - Abnormal; Notable for the following components:      Result Value   WBC 11.1 (*)    All other components within normal limits  COMPREHENSIVE METABOLIC PANEL - Abnormal; Notable for the following components:   Glucose, Bld 108 (*)    Calcium 8.8 (*)    All other components within normal limits  URINALYSIS, COMPLETE (UACMP) WITH MICROSCOPIC - Abnormal; Notable for the following components:   Color, Urine YELLOW (*)    APPearance CLEAR (*)    Squamous Epithelial / LPF 0-5 (*)    All other components within normal limits  TROPONIN I  LIPASE, BLOOD  INFLUENZA PANEL BY PCR (TYPE A & B)   ____________________________________________  EKG   ____________________________________________  RADIOLOGY Lexine Baton, personally viewed and evaluated these images (plain radiographs) as part of my medical decision making, as well as reviewing the written report by the radiologist.  Dg Chest 2 View  Result Date:  05/15/2017 CLINICAL DATA:  Cough for 2 weeks. EXAM: CHEST - 2 VIEW COMPARISON:  10/02/2010 chest radiograph FINDINGS: The cardiomediastinal silhouette is unremarkable. Mild chronic peribronchial thickening again noted. There is no evidence of focal airspace disease, pulmonary edema, suspicious pulmonary nodule/mass, pleural effusion, or pneumothorax. No acute bony abnormalities are identified. IMPRESSION: No active cardiopulmonary disease. Electronically Signed   By: Harmon Pier M.D.   On: 05/15/2017 14:50    ____________________________________________    PROCEDURES  Procedure(s) performed:    Procedures    Medications  sodium chloride 0.9 % bolus 1,000 mL (0 mLs Intravenous Stopped 05/15/17 1557)  acetaminophen (TYLENOL) tablet 650 mg (650 mg Oral Given 05/15/17 1420)  ondansetron (ZOFRAN) injection 4 mg (4 mg Intravenous Given 05/15/17 1419)  ipratropium-albuterol (DUONEB) 0.5-2.5 (3) MG/3ML nebulizer solution 3 mL (3 mLs Nebulization Given 05/15/17 1602)  meclizine (ANTIVERT) tablet 25 mg (25 mg Oral Given 05/15/17 1610)  predniSONE (DELTASONE) tablet 60 mg (60 mg Oral Given 05/15/17 1602)  ondansetron (ZOFRAN-ODT) disintegrating tablet 4 mg (4 mg Oral Given 05/15/17 1739)     ____________________________________________   INITIAL IMPRESSION / ASSESSMENT AND PLAN / ED COURSE  Pertinent labs & imaging results that were available during my care of the patient were reviewed by me and considered in my medical decision making (see chart for details).  Review of the [place] CSRS was performed in accordance of the [facility] prior to dispensing any controlled drugs.   Patient's diagnosis is consistent with post viral vestibular neuritis following influenza. Vital signs and exam are reassuring.  Patient has flulike symptoms. No pneumonia on chest xray. No acute changes on EKG per MD. CBC, CMP, lipase are reassuring. Troponin <.03. No infection on urinalysis. Dizziness improved with fluid bolus,  meclazine, and high dose prednisone. Patient has been started on a low dose of 20mg  prednisone and we discussed that this dose is likely not high enough for vestibular neuritis. Patient will continue Levaquin. Patient will be discharged home with prescriptions for Meclizine and Prednisone. Patient is to follow up with PCP as needed or otherwise directed. Patient is given ED precautions to return to the ED for any worsening or new symptoms.     ____________________________________________  FINAL CLINICAL IMPRESSION(S) / ED DIAGNOSES  Final diagnoses:  Vestibular neuronitis, unspecified laterality  Influenza-like illness      NEW MEDICATIONS STARTED DURING THIS VISIT:  ED Discharge Orders        Ordered  meclizine (ANTIVERT) 25 MG tablet  3 times daily PRN     05/15/17 1631    predniSONE (DELTASONE) 50 MG tablet     05/15/17 1631          This chart was dictated using voice recognition software/Dragon. Despite best efforts to proofread, errors can occur which can change the meaning. Any change was purely unintentional.    Enid Derry, PA-C 05/16/17 1610    Sharyn Creamer, MD 05/16/17 1401

## 2017-05-15 NOTE — ED Triage Notes (Signed)
Cough x 2 weeks

## 2017-05-15 NOTE — ED Notes (Signed)
Pt requesting nausea medication before discharge. Pt ambulatory and in NAD at this time.

## 2017-05-23 ENCOUNTER — Other Ambulatory Visit: Payer: Self-pay

## 2017-05-23 ENCOUNTER — Encounter: Payer: Self-pay | Admitting: *Deleted

## 2017-05-23 ENCOUNTER — Emergency Department: Payer: 59

## 2017-05-23 ENCOUNTER — Emergency Department
Admission: EM | Admit: 2017-05-23 | Discharge: 2017-05-23 | Disposition: A | Discharge status: Home / Self Care | Payer: 59 | Attending: Emergency Medicine | Admitting: Emergency Medicine

## 2017-05-23 DIAGNOSIS — M79661 Pain in right lower leg: Secondary | ICD-10-CM | POA: Insufficient documentation

## 2017-05-23 DIAGNOSIS — I10 Essential (primary) hypertension: Secondary | ICD-10-CM | POA: Diagnosis not present

## 2017-05-23 DIAGNOSIS — Z79899 Other long term (current) drug therapy: Secondary | ICD-10-CM | POA: Diagnosis not present

## 2017-05-23 DIAGNOSIS — M79662 Pain in left lower leg: Secondary | ICD-10-CM | POA: Diagnosis not present

## 2017-05-23 DIAGNOSIS — J069 Acute upper respiratory infection, unspecified: Secondary | ICD-10-CM | POA: Diagnosis not present

## 2017-05-23 DIAGNOSIS — R05 Cough: Secondary | ICD-10-CM | POA: Diagnosis present

## 2017-05-23 DIAGNOSIS — B9789 Other viral agents as the cause of diseases classified elsewhere: Secondary | ICD-10-CM | POA: Diagnosis not present

## 2017-05-23 DIAGNOSIS — R252 Cramp and spasm: Secondary | ICD-10-CM

## 2017-05-23 LAB — CBC
HCT: 43.1 % (ref 40.0–52.0)
Hemoglobin: 14.5 g/dL (ref 13.0–18.0)
MCH: 28.6 pg (ref 26.0–34.0)
MCHC: 33.8 g/dL (ref 32.0–36.0)
MCV: 84.7 fL (ref 80.0–100.0)
Platelets: 195 10*3/uL (ref 150–440)
RBC: 5.09 MIL/uL (ref 4.40–5.90)
RDW: 14.6 % — ABNORMAL HIGH (ref 11.5–14.5)
WBC: 5.9 10*3/uL (ref 3.8–10.6)

## 2017-05-23 LAB — COMPREHENSIVE METABOLIC PANEL
ALT: 50 U/L (ref 17–63)
AST: 23 U/L (ref 15–41)
Albumin: 3.6 g/dL (ref 3.5–5.0)
Alkaline Phosphatase: 47 U/L (ref 38–126)
Anion gap: 4 — ABNORMAL LOW (ref 5–15)
BUN: 15 mg/dL (ref 6–20)
CO2: 29 mmol/L (ref 22–32)
Calcium: 8.5 mg/dL — ABNORMAL LOW (ref 8.9–10.3)
Chloride: 108 mmol/L (ref 101–111)
Creatinine, Ser: 0.73 mg/dL (ref 0.61–1.24)
GFR calc Af Amer: >60 mL/min (ref >60)
GFR calc non Af Amer: >60 mL/min (ref >60)
Glucose, Bld: 101 mg/dL — ABNORMAL HIGH (ref 65–99)
Potassium: 3.6 mmol/L (ref 3.5–5.1)
Sodium: 141 mmol/L (ref 135–145)
Total Bilirubin: 0.8 mg/dL (ref 0.3–1.2)
Total Protein: 6.4 g/dL — ABNORMAL LOW (ref 6.5–8.1)

## 2017-05-23 LAB — RAPID HIV SCREEN (HIV 1/2 AB+AG)
HIV 1/2 Antibodies: NONREACTIVE
HIV-1 P24 Antigen - HIV24: NONREACTIVE

## 2017-05-23 LAB — TROPONIN I: Troponin I: <0.03 ng/mL (ref <0.03)

## 2017-05-23 MED ORDER — AZITHROMYCIN 250 MG PO TABS: ORAL_TABLET | ORAL | 0 refills | Status: DC

## 2017-05-23 MED ORDER — AZITHROMYCIN 500 MG PO TABS
500.0000 mg | ORAL_TABLET | Freq: Once | ORAL | Status: AC
  Administered 2017-05-23: 500 mg via ORAL
  Filled 2017-05-23: qty 1

## 2017-05-23 MED ORDER — ACETAMINOPHEN 500 MG PO TABS
1000.0000 mg | ORAL_TABLET | Freq: Once | ORAL | Status: AC
  Administered 2017-05-23: 1000 mg via ORAL
  Filled 2017-05-23: qty 2

## 2017-05-23 MED ORDER — METHOCARBAMOL 500 MG PO TABS: 500.0000 mg | ORAL_TABLET | Freq: Four times a day (QID) | ORAL | 0 refills | Status: DC | PRN

## 2017-05-23 NOTE — ED Notes (Signed)
esign not working, pt verbalized discharge instructions and has no questions at this time 

## 2017-05-23 NOTE — ED Triage Notes (Addendum)
Per Spanish interpreter DaytonRafael, Patient c/o fever at night and cough for one month. Patient also c/o epistaxis. Patient also c/o weakness and chest pain with cough. Patient was seen on 3/31 for c/o cough and prescribed Prednisone and Levaquin and finished course of treatment. Diaphoresis noted in triage.

## 2017-05-23 NOTE — ED Notes (Signed)
Interpreter requested for discharge purposes

## 2017-05-23 NOTE — ED Provider Notes (Addendum)
[facility] Emergency Department Provider Note  ____________________________________________  Time seen: Approximately 3:12 PM  I have reviewed the triage vital signs and the nursing notes.   HISTORY  Chief Complaint Fever and Cough   HPI Allen Chapman is a 57 y.o. male with a history of hypertension and BPH who presents for evaluation of fever and cough.  Patient reports 20 days of cough productive of yellow sputum.  He reports daily subjective fever mostly in the evening time. Also complaining of malaise and decreased appetite.  No unintentional weight loss, no night sweats, no history of smoking, no personal or family history of cancer, blood clots, recent travel mobilization, hemoptysis, exogenous hormones.  Patient reports mild intermittent shortness of breath none at this time.  Also complains of a pressure-like pain in the center of his chest every time he coughs however when he is not coughing the pain goes away.  No pleuritic chest pain.  No known exposure to tuberculosis.  Patient is also complaining of bilateral leg pain from the knee down which is present when he walks a lot for the last week.  He reports that he walks a lot at work in the last few days has had trouble walking due to the severity of the pain.  He denies numbness or swelling of his legs. No sore throat, vomiting, diarrhea.   Past Medical History:  Diagnosis Date  . Erectile dysfunction 07/02/2014  . GERD (gastroesophageal reflux disease)   . Hematuria, microscopic 07/02/2014  . Hypogonadism in male 07/02/2014  . Inguinal hernia, left 07/02/2014  . Kidney stone   . Prostate enlargement     Patient Active Problem List   Diagnosis Date Noted  . HTN (hypertension) 09/10/2015  . Plantar fasciitis of left foot 09/10/2015  . Chest wall pain 04/03/2015  . Gastritis and gastroduodenitis 04/03/2015  . Insomnia 04/03/2015  . Snoring 04/03/2015  . Chronic insomnia 10/31/2014  . Elevated  BP 10/31/2014  . Gastroesophageal reflux disease without esophagitis 10/31/2014  . Erectile dysfunction 07/02/2014  . Hypogonadism in male 07/02/2014  . Difficult or painful urination 05/19/2014  . Enlarged prostate 05/19/2014  . ED (erectile dysfunction) of organic origin 05/19/2014  . 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 Hernia, inguinal 05/19/2014  . Hematuria, microscopic 05/19/2014  . Prostatitis 05/19/2014  . Calculus of kidney 05/19/2014  . Diarrhea 01/17/2014  . Excessive urination at night 07/01/2012  . Must strain to pass urine 07/01/2012  . Urgency of micturation 07/01/2012  . FOM (frequency of micturition) 07/01/2012    Past Surgical History:  Procedure Laterality Date  . APPENDECTOMY  1980  . CYSTOSCOPY  03/31/2014  . EXTRACORPOREAL SHOCK WAVE LITHOTRIPSY Left 10/10/2015   Procedure: EXTRACORPOREAL SHOCK WAVE LITHOTRIPSY (ESWL);  Surgeon: Hildred Laser, MD;  Location: [facility] ORS;  Service: Urology;  Laterality: Left;  . LITHOTRIPSY      Prior to Admission medications   Medication Sig Start Date End Date Taking? Authorizing Provider  azithromycin (ZITHROMAX) 250 MG tablet Take 1 a day for 4 days 05/23/17   Don Perking, [place], MD  famotidine (PEPCID) 20 MG tablet Take 1 tablet (20 mg total) by mouth 2 (two) times daily. 09/02/16   Sharman Cheek, MD  finasteride (PROSCAR) 5 MG tablet Take 1 tablet (5 mg total) by mouth daily. 11/22/15   Hildred Laser, MD  meclizine (ANTIVERT) 25 MG tablet Take 1 tablet (25 mg total) by mouth 3 (three) times daily as needed for dizziness. 05/15/17   Enid Derry,  PA-C  methocarbamol (ROBAXIN) 500 MG tablet Take 1 tablet (500 mg total) by mouth every 6 (six) hours as needed for muscle spasms. 05/23/17   Nita Sickle, MD  metoCLOPramide (REGLAN) 10 MG tablet Take 1 tablet (10 mg total) by mouth every 6 (six) hours as needed. 09/02/16   Sharman Cheek, MD  omeprazole (PRILOSEC) 40 MG capsule Take 40 mg by mouth daily.    [provider]  ondansetron (ZOFRAN ODT) 4 MG disintegrating tablet Take 1 tablet (4 mg total) by mouth every 8 (eight) hours as needed for nausea or vomiting. 09/02/16   Sharman Cheek, MD  ondansetron (ZOFRAN) 4 MG tablet Take 1 tablet (4 mg total) by mouth every 8 (eight) hours as needed for nausea or vomiting. Patient not taking: Reported on 11/22/2015 10/03/15   Jeanmarie Plant, MD  predniSONE (DELTASONE) 50 MG tablet Take 1 tab per day 05/15/17   Enid Derry, PA-C  sucralfate (CARAFATE) 1 g tablet Take 1 tablet (1 g total) by mouth 4 (four) times daily. 09/02/16   Sharman Cheek, MD  temazepam (RESTORIL) 15 MG capsule Take 15 mg by mouth at bedtime. 07/21/15   [provider]  valACYclovir (VALTREX) 1000 MG tablet Take 1 tablet by mouth 3 (three) times daily. For 7 days 09/25/15   [provider]    Allergies Nortriptyline and Flomax  [tamsulosin hcl]  Family History  Problem Relation Age of Onset  . Bladder Cancer Neg Hx   . Kidney cancer Neg Hx   . Prostate cancer Neg Hx     Social History Social History   Tobacco Use  . Smoking status: Never Smoker  . Smokeless tobacco: Never Used  Substance Use Topics  . Alcohol use: No    Alcohol/week: 0.0 oz  . Drug use: No    Review of Systems  Constitutional: + fever. Eyes: Negative for visual changes. ENT: Negative for sore throat. Neck: No neck pain  Cardiovascular: + chest pain. Respiratory: + shortness of breath, cough Gastrointestinal: Negative for abdominal pain, vomiting or diarrhea. Genitourinary: Negative for dysuria. Musculoskeletal: Negative for back pain. + b/l leg pain Skin: Negative for rash. Neurological: Negative for headaches, weakness or numbness. Psych: No SI or HI  ____________________________________________   PHYSICAL EXAM:  VITAL SIGNS: ED Triage Vitals  Enc Vitals Group     BP 05/23/17 1348 (!) 161/100     Pulse Rate 05/23/17 1348 92     Resp 05/23/17 1348 18     Temp  05/23/17 1348 99 F (37.2 C)     Temp Source 05/23/17 1348 Oral     SpO2 05/23/17 1348 97 %     Weight 05/23/17 1349 172 lb (78 kg)     Height 05/23/17 1349 5\' 6"  (1.676 m)     Head Circumference --      Peak Flow --      Pain Score 05/23/17 1349 9     Pain Loc --      Pain Edu? --      Excl. in GC? --     Constitutional: Alert and oriented. Well appearing and in no apparent distress. HEENT:      Head: Normocephalic and atraumatic.         Eyes: Conjunctivae are normal. Sclera is non-icteric.       Mouth/Throat: Mucous membranes are moist.       Neck: Supple with no signs of meningismus. Cardiovascular: Regular rate and rhythm. No murmurs, gallops, or rubs.  2+ symmetrical distal pulses are present in all extremities. No JVD. Respiratory: Normal respiratory effort. Lungs are clear to auscultation bilaterally. No wheezes, crackles, or rhonchi.  Gastrointestinal: Soft, non tender, and non distended with positive bowel sounds. No rebound or guarding. Genitourinary: No CVA tenderness. Musculoskeletal: Nontender with normal range of motion in all extremities. No edema, cyanosis, or erythema of extremities. Neurologic: Normal speech and language. Face is symmetric. Moving all extremities. No gross focal neurologic deficits are appreciated. Skin: Skin is warm, dry and intact. No rash noted. Psychiatric: Mood and affect are normal. Speech and behavior are normal.  ____________________________________________   LABS (all labs ordered are listed, but only abnormal results are displayed)  Labs Reviewed  CBC - Abnormal; Notable for the following components:      Result Value   RDW 14.6 (*)    All other components within normal limits  COMPREHENSIVE METABOLIC PANEL - Abnormal; Notable for the following components:   Glucose, Bld 101 (*)    Calcium 8.5 (*)    Total Protein 6.4 (*)    Anion gap 4 (*)    All other components within normal limits  TROPONIN I  RAPID HIV SCREEN (HIV 1/2  AB+AG)   ____________________________________________  EKG  ED ECG REPORT I, Nita Sickle, the attending physician, personally viewed and interpreted this ECG.  Normal sinus rhythm, rate of 80, normal intervals, normal axis, no ST elevations or depressions.  Unchanged from prior. ____________________________________________  RADIOLOGY  I have personally reviewed the images performed during this visit and I agree with the Radiologist's read.   Interpretation by Radiologist:  Dg Chest 2 View  Result Date: 05/23/2017 CLINICAL DATA:  Fever at night with cough for 1 month. Weakness. Chest pain with cough. EXAM: CHEST - 2 VIEW COMPARISON:  05/15/2017 FINDINGS: Cardiac silhouette is normal in size. No mediastinal or hilar masses. No evidence of adenopathy. Mild linear scarring or atelectasis at the anterior lung bases on the lateral view, stable from the prior study. Lungs otherwise clear. No pleural effusion or pneumothorax. Skeletal structures are unremarkable. IMPRESSION: No active cardiopulmonary disease. Electronically Signed   By: Amie Portland M.D.   On: 05/23/2017 14:44   US Venous Img Lower Bilateral  Result Date: 05/23/2017 CLINICAL DATA:  Bilateral leg pain EXAM: BILATERAL LOWER EXTREMITY VENOUS DOPPLER ULTRASOUND TECHNIQUE: Gray-scale sonography with graded compression, as well as color Doppler and duplex ultrasound were performed to evaluate the lower extremity deep venous systems from the level of the common femoral vein and including the common femoral, femoral, profunda femoral, popliteal and calf veins including the posterior tibial, peroneal and gastrocnemius veins when visible. The superficial great saphenous vein was also interrogated. Spectral Doppler was utilized to evaluate flow at rest and with distal augmentation maneuvers in the common femoral, femoral and popliteal veins. COMPARISON:  None. FINDINGS: RIGHT LOWER EXTREMITY Common Femoral Vein: No evidence of thrombus.  Normal compressibility, respiratory phasicity and response to augmentation. Saphenofemoral Junction: No evidence of thrombus. Normal compressibility and flow on color Doppler imaging. Profunda Femoral Vein: No evidence of thrombus. Normal compressibility and flow on color Doppler imaging. Femoral Vein: No evidence of thrombus. Normal compressibility, respiratory phasicity and response to augmentation. Popliteal Vein: No evidence of thrombus. Normal compressibility, respiratory phasicity and response to augmentation. Calf Veins: No evidence of thrombus. Normal compressibility and flow on color Doppler imaging. Superficial Great Saphenous Vein: No evidence of thrombus. Normal compressibility. Venous Reflux:  None. Other Findings:  None. LEFT LOWER EXTREMITY Common Femoral Vein:  No evidence of thrombus. Normal compressibility, respiratory phasicity and response to augmentation. Saphenofemoral Junction: No evidence of thrombus. Normal compressibility and flow on color Doppler imaging. Profunda Femoral Vein: No evidence of thrombus. Normal compressibility and flow on color Doppler imaging. Femoral Vein: No evidence of thrombus. Normal compressibility, respiratory phasicity and response to augmentation. Popliteal Vein: No evidence of thrombus. Normal compressibility, respiratory phasicity and response to augmentation. Calf Veins: No evidence of thrombus. Normal compressibility and flow on color Doppler imaging. Superficial Great Saphenous Vein: No evidence of thrombus. Normal compressibility. Venous Reflux:  None. Other Findings:  None. IMPRESSION: No evidence of deep venous thrombosis bilaterally. Electronically Signed   By: Mark  Lukens M.D.   On: 05/23/2017 16:08     _______________Alcide Clever_____________________________   PROCEDURES  Procedure(s) performed: None Procedures Critical Care performed:  None ____________________________________________   INITIAL IMPRESSION / ASSESSMENT AND PLAN / ED COURSE  57 y.o.  male with a history of hypertension and BPH who presents for evaluation of fever, productive cough, SOB and CP for 20 days. Also complaining of bilateral leg pain with ambulation x 1 week.    #cough, subjective fever x 20 days: viral URI vs Flu vs PNA.  Patient is extremely well-appearing here, normal vital signs, normal work of breathing, lungs are clear to auscultation.  Chest x-ray showed no infiltrate.  EKG low if this of ischemia.  Labs showing a white count, normal troponin.  Will start patient on a Z-Pak since he has been sick for 20 days.   # bilateral leg pain: there is no swelling, no erythema or rash, no palpable cords, extremities are warm and well perfused with strong PT and DP pulses bilaterally. Doppler US done to eval for DVT and negative. Possibly pain from cramps since they happen only when he walks. Will prescribe robaxin.  Patient remains well appearing. Will dc home with f/u with PCP. Discussed return precautions with patient.   As part of my medical decision making, I reviewed the following data within the electronic MEDICAL RECORD NUMBER Nursing notes reviewed and incorporated, Labs reviewed , EKG interpreted , Old chart reviewed, Radiograph reviewed , Notes from prior ED visits and [place] Controlled Substance Database    Pertinent labs & imaging results that were available during my care of the patient were reviewed by me and considered in my medical decision making (see chart for details).   ____________________________________________   FINAL CLINICAL IMPRESSION(S) / ED DIAGNOSES  Final diagnoses:  Viral URI with cough  Leg cramps      NEW MEDICATIONS STARTED DURING THIS VISIT:  ED Discharge Orders        Ordered    azithromycin (ZITHROMAX) 250 MG tablet     05/23/17 1624    methocarbamol (ROBAXIN) 500 MG tablet  Every 6 hours PRN     05/23/17 1624       Note:  This document was prepared using Dragon voice recognition software and may include unintentional  dictation errors.    Don PerkingVeronese, [place], MD 05/23/17 1625    Don PerkingVeronese, [place], MD 05/23/17 [phone]

## 2017-06-08 ENCOUNTER — Other Ambulatory Visit: Payer: Self-pay | Admitting: Family Medicine

## 2017-06-08 ENCOUNTER — Ambulatory Visit
Admission: RE | Admit: 2017-06-08 | Discharge: 2017-06-08 | Disposition: A | Discharge status: Home / Self Care | Payer: 59 | Source: Ambulatory Visit | Attending: Family Medicine | Admitting: Family Medicine

## 2017-06-08 ENCOUNTER — Ambulatory Visit
Admission: RE | Admit: 2017-06-08 | Discharge: 2017-06-08 | Disposition: A | Discharge status: Home / Self Care | Payer: No Typology Code available for payment source | Source: Ambulatory Visit | Attending: Family Medicine | Admitting: Family Medicine

## 2017-06-08 DIAGNOSIS — X58XXXA Exposure to other specified factors, initial encounter: Secondary | ICD-10-CM | POA: Insufficient documentation

## 2017-06-08 DIAGNOSIS — S0083XA Contusion of other part of head, initial encounter: Secondary | ICD-10-CM

## 2017-09-29 ENCOUNTER — Ambulatory Visit: Payer: 59 | Admitting: Podiatry

## 2017-10-05 ENCOUNTER — Emergency Department: Payer: 59

## 2017-10-05 ENCOUNTER — Encounter: Payer: Self-pay | Admitting: Emergency Medicine

## 2017-10-05 ENCOUNTER — Emergency Department
Admission: EM | Admit: 2017-10-05 | Discharge: 2017-10-05 | Disposition: A | Discharge status: Home / Self Care | Payer: 59 | Attending: Emergency Medicine | Admitting: Emergency Medicine

## 2017-10-05 DIAGNOSIS — M62838 Other muscle spasm: Secondary | ICD-10-CM | POA: Diagnosis not present

## 2017-10-05 DIAGNOSIS — Z79899 Other long term (current) drug therapy: Secondary | ICD-10-CM | POA: Diagnosis not present

## 2017-10-05 DIAGNOSIS — R252 Cramp and spasm: Secondary | ICD-10-CM | POA: Diagnosis present

## 2017-10-05 LAB — CBC WITH DIFFERENTIAL/PLATELET
Basophils Absolute: 0 10*3/uL (ref 0–0.1)
Basophils Relative: 1 %
Eosinophils Absolute: 0.2 10*3/uL (ref 0–0.7)
Eosinophils Relative: 5 %
HCT: 40.4 % (ref 40.0–52.0)
Hemoglobin: 13.9 g/dL (ref 13.0–18.0)
Lymphocytes Relative: 42 %
Lymphs Abs: 2.1 10*3/uL (ref 1.0–3.6)
MCH: 28.4 pg (ref 26.0–34.0)
MCHC: 34.5 g/dL (ref 32.0–36.0)
MCV: 82.4 fL (ref 80.0–100.0)
Monocytes Absolute: 0.4 10*3/uL (ref 0.2–1.0)
Monocytes Relative: 9 %
Neutro Abs: 2.1 10*3/uL (ref 1.4–6.5)
Neutrophils Relative %: 43 %
Platelets: 173 10*3/uL (ref 150–440)
RBC: 4.9 MIL/uL (ref 4.40–5.90)
RDW: 14.6 % — ABNORMAL HIGH (ref 11.5–14.5)
WBC: 4.9 10*3/uL (ref 3.8–10.6)

## 2017-10-05 LAB — COMPREHENSIVE METABOLIC PANEL
ALT: 21 U/L (ref 0–44)
AST: 16 U/L (ref 15–41)
Albumin: 3.3 g/dL — ABNORMAL LOW (ref 3.5–5.0)
Alkaline Phosphatase: 44 U/L (ref 38–126)
Anion gap: 6 (ref 5–15)
BUN: 17 mg/dL (ref 6–20)
CO2: 27 mmol/L (ref 22–32)
Calcium: 8.2 mg/dL — ABNORMAL LOW (ref 8.9–10.3)
Chloride: 106 mmol/L (ref 98–111)
Creatinine, Ser: 0.57 mg/dL — ABNORMAL LOW (ref 0.61–1.24)
GFR calc Af Amer: >60 mL/min (ref >60)
GFR calc non Af Amer: >60 mL/min (ref >60)
Glucose, Bld: 96 mg/dL (ref 70–99)
Potassium: 3.5 mmol/L (ref 3.5–5.1)
Sodium: 139 mmol/L (ref 135–145)
Total Bilirubin: 0.7 mg/dL (ref 0.3–1.2)
Total Protein: 5.8 g/dL — ABNORMAL LOW (ref 6.5–8.1)

## 2017-10-05 LAB — TROPONIN I: Troponin I: <0.03 ng/mL (ref <0.03)

## 2017-10-05 MED ORDER — KETOROLAC TROMETHAMINE 60 MG/2ML IM SOLN
60.0000 mg | Freq: Once | INTRAMUSCULAR | Status: AC
  Administered 2017-10-05: 60 mg via INTRAMUSCULAR
  Filled 2017-10-05: qty 2

## 2017-10-05 MED ORDER — DIAZEPAM 5 MG PO TABS
5.0000 mg | ORAL_TABLET | Freq: Once | ORAL | Status: AC
  Administered 2017-10-05: 5 mg via ORAL
  Filled 2017-10-05: qty 1

## 2017-10-05 MED ORDER — DIAZEPAM 5 MG PO TABS: 5.0000 mg | ORAL_TABLET | ORAL | 0 refills | Status: AC

## 2017-10-05 NOTE — ED Notes (Signed)
.   Pt is resting, Respirations even and unlabored, NAD. Stretcher lowest postion and locked. Call bell within reach. Denies any needs at this time RN will continue to monitor.    

## 2017-10-05 NOTE — ED Notes (Signed)
Per Dr. Farrel GobbleMalinda's instructions this nurse wrote work excuse for three days.

## 2017-10-05 NOTE — ED Provider Notes (Signed)
[facility] Emergency Department Provider Note   ____________________________________________   First MD Initiated Contact with Patient 10/05/17 0920     (approximate)  I have reviewed the triage vital signs and the nursing notes.   HISTORY  Chief Complaint Back Pain    HPI Allen Chapman is a 57 y.o. male who complains of pain and muscle spasms in his neck and back.  Runs around to his chest as well.  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  He has had this for some time seeing his doctor and gotten medications but that has not helped.  Please see nurse's note for the list of medications he is gotten.  He is using Motrin at home and a heating pad and is still not working either.  Pain is present all the time but made worse with movement or if he pushes on his back where it spasming.  Past Medical History:  Diagnosis Date  . Erectile dysfunction 07/02/2014  . GERD (gastroesophageal reflux disease)   . Hematuria, microscopic 07/02/2014  . Hypogonadism in male 07/02/2014  . Inguinal hernia, left 07/02/2014  . Kidney stone   . Prostate enlargement     Patient Active Problem List   Diagnosis Date Noted  . HTN (hypertension) 09/10/2015  . Plantar fasciitis of left foot 09/10/2015  . Chest wall pain 04/03/2015  . Gastritis and gastroduodenitis 04/03/2015  . Insomnia 04/03/2015  . Snoring 04/03/2015  . Chronic insomnia 10/31/2014  . Elevated BP 10/31/2014  . Gastroesophageal reflux disease without esophagitis 10/31/2014  . Erectile dysfunction 07/02/2014  . Hypogonadism in male 07/02/2014  . Difficult or painful urination 05/19/2014  . Enlarged prostate 05/19/2014  . ED (erectile dysfunction) of organic origin 05/19/2014  . 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 Hernia, inguinal 05/19/2014  . Hematuria, microscopic 05/19/2014  . Prostatitis 05/19/2014  . Calculus of kidney 05/19/2014  . Diarrhea 01/17/2014  . Excessive urination at night  07/01/2012  . Must strain to pass urine 07/01/2012  . Urgency of micturation 07/01/2012  . FOM (frequency of micturition) 07/01/2012    Past Surgical History:  Procedure Laterality Date  . APPENDECTOMY  1980  . CYSTOSCOPY  03/31/2014  . EXTRACORPOREAL SHOCK WAVE LITHOTRIPSY Left 10/10/2015   Procedure: EXTRACORPOREAL SHOCK WAVE LITHOTRIPSY (ESWL);  Surgeon: Hildred Laser, MD;  Location: [facility] ORS;  Service: Urology;  Laterality: Left;  . LITHOTRIPSY      Prior to Admission medications   Medication Sig Start Date End Date Taking? Authorizing Provider  doxazosin (CARDURA) 2 MG tablet Take 1 tablet by mouth daily. 08/26/17  Yes [provider]  omeprazole (PRILOSEC) 40 MG capsule Take 40 mg by mouth daily.   Yes [provider]  predniSONE (DELTASONE) 20 MG tablet Take 1 tablet by mouth 2 (two) times daily. 09/29/17  Yes [provider]  temazepam (RESTORIL) 15 MG capsule Take 15 mg by mouth at bedtime. 07/21/15  Yes [provider]  tiZANidine (ZANAFLEX) 4 MG tablet Take 1 tablet by mouth 3 (three) times daily. 09/29/17  Yes [provider]  azithromycin (ZITHROMAX) 250 MG tablet Take 1 a day for 4 days Patient not taking: Reported on 10/05/2017 05/23/17   Nita Sickle, MD  diazepam (VALIUM) 5 MG tablet Take 1 tablet (5 mg total) by mouth 1 day or 1 dose for 1 dose. Please disregard the above information.  Take 1 pill in the morning.  Do not take any in the evening as you are  taking the temazepam. 10/05/17 10/06/17  Arnaldo NatalMalinda, Paul F, MD  famotidine (PEPCID) 20 MG tablet Take 1 tablet (20 mg total) by mouth 2 (two) times daily. Patient not taking: Reported on 10/05/2017 09/02/16   Sharman CheekStafford, Phillip, MD  finasteride (PROSCAR) 5 MG tablet Take 1 tablet (5 mg total) by mouth daily. Patient not taking: Reported on 10/05/2017 11/22/15   Hildred LaserBudzyn, Brian James, MD  meclizine (ANTIVERT) 25 MG tablet Take 1 tablet (25 mg total) by mouth 3 (three) times daily as  needed for dizziness. Patient not taking: Reported on 10/05/2017 05/15/17   Enid DerryWagner, Ashley, PA-C  methocarbamol (ROBAXIN) 500 MG tablet Take 1 tablet (500 mg total) by mouth every 6 (six) hours as needed for muscle spasms. Patient not taking: Reported on 10/05/2017 05/23/17   Nita SickleVeronese, [place], MD  metoCLOPramide (REGLAN) 10 MG tablet Take 1 tablet (10 mg total) by mouth every 6 (six) hours as needed. Patient not taking: Reported on 10/05/2017 09/02/16   Sharman CheekStafford, Phillip, MD  ondansetron (ZOFRAN ODT) 4 MG disintegrating tablet Take 1 tablet (4 mg total) by mouth every 8 (eight) hours as needed for nausea or vomiting. Patient not taking: Reported on 10/05/2017 09/02/16   Sharman CheekStafford, Phillip, MD  ondansetron (ZOFRAN) 4 MG tablet Take 1 tablet (4 mg total) by mouth every 8 (eight) hours as needed for nausea or vomiting. Patient not taking: Reported on 11/22/2015 10/03/15   Jeanmarie PlantMcShane, James A, MD  sucralfate (CARAFATE) 1 g tablet Take 1 tablet (1 g total) by mouth 4 (four) times daily. Patient not taking: Reported on 10/05/2017 09/02/16   Sharman CheekStafford, Phillip, MD    Allergies Nortriptyline and Flomax  [tamsulosin hcl]  Family History  Problem Relation Age of Onset  . Bladder Cancer Neg Hx   . Kidney cancer Neg Hx   . Prostate cancer Neg Hx     Social History Social History   Tobacco Use  . Smoking status: Never Smoker  . Smokeless tobacco: Never Used  Substance Use Topics  . Alcohol use: No    Alcohol/week: 0.0 standard drinks  . Drug use: No    Review of Systems  Constitutional: No fever/chills Eyes: No visual changes. ENT: No sore throat. Cardiovascular: HPI Respiratory: Denies shortness of breath. Gastrointestinal: No abdominal pain.  No nausea, no vomiting.  No diarrhea.  No constipation. Genitourinary: Negative for dysuria. Musculoskeletal: See HPI n. Skin: Negative for rash. Neurological: Negative for headaches, focal weakness    ____________________________________________   PHYSICAL EXAM:  VITAL SIGNS: ED Triage Vitals  Enc Vitals Group     BP 10/05/17 0840 140/78     Pulse Rate 10/05/17 0840 69     Resp 10/05/17 0840 18     Temp 10/05/17 0840 98.3 F (36.8 C)     Temp Source 10/05/17 0840 Oral     SpO2 10/05/17 0840 96 %     Weight 10/05/17 0841 172 lb (78 kg)     Height 10/05/17 0841 5\' 6"  (1.676 m)     Head Circumference --      Peak Flow --      Pain Score 10/05/17 0848 10     Pain Loc --      Pain Edu? --      Excl. in GC? --     Constitutional: Alert and oriented.  In pain in the area described in HPI Eyes: Conjunctivae are normal.. Head: Atraumatic. Nose: No congestion/rhinnorhea. Mouth/Throat: Mucous membranes are moist.  Oropharynx non-erythematous. Neck: No stridor.  Cardiovascular: Normal  rate, regular rhythm. Grossly normal heart sounds.  Good peripheral circulation. Respiratory: Normal respiratory effort.  No retractions. Lungs CTAB. Gastrointestinal: Soft and nontender. No distention. No abdominal bruits. No CVA tenderness. Musculoskeletal: Patient has palpable muscle spasms from the base of the neck into the trapezius and down in the rhomboid area.  This responds somewhat to some massage which I gave him. Neurologic:  Normal speech and language. No gross focal neurologic deficits are appreciated. . Skin:  Skin is warm, dry and intact. No rash noted. Psychiatric: Mood and affect are normal. Speech and behavior are normal.  ____________________________________________   LABS (all labs ordered are listed, but only abnormal results are displayed)  Labs Reviewed  COMPREHENSIVE METABOLIC PANEL - Abnormal; Notable for the following components:      Result Value   Creatinine, Ser 0.57 (*)    Calcium 8.2 (*)    Total Protein 5.8 (*)    Albumin 3.3 (*)    All other components within normal limits  CBC WITH DIFFERENTIAL/PLATELET - Abnormal; Notable for the following components:    RDW 14.6 (*)    All other components within normal limits  TROPONIN I   ____________________________________________  EKG  EKG read interpreted by me shows sinus bradycardia rate of 59 left axis no acute ST-T wave changes ____________________________________________  RADIOLOGY  ED MD interpretation: X-ray read by radiology reviewed by me shows no acute changes  Official radiology report(s): Dg Chest 2 View  Result Date: 10/05/2017 CLINICAL DATA:  Upper back pain radiating into the neck for the past few weeks. EXAM: CHEST - 2 VIEW COMPARISON:  Chest x-ray dated May 23, 2017. FINDINGS: The heart size and mediastinal contours are within normal limits. Normal pulmonary vascularity. Stable linear scarring at the lung bases. No focal consolidation, pleural effusion, or pneumothorax. No acute osseous abnormality. IMPRESSION: No active cardiopulmonary disease. Electronically Signed   By: Obie DredgeWilliam T Derry M.D.   On: 10/05/2017 10:20    ____________________________________________   PROCEDURES  Procedure(s) performed:   Procedures  Critical Care performed:   ____________________________________________   INITIAL IMPRESSION / ASSESSMENT AND PLAN / ED COURSE  Patient's lab work is essentially negative x-rays are negative EKG is okay do not see any sign of any pathology.  He just has the palpable muscle spasms.  I will have him either work light duty for a week or stop working for 3 days.  I will have him follow with PT for further treatment.  Have him return if he is worse.  We will give him Motrin and Valium for the time being.         ____________________________________________   FINAL CLINICAL IMPRESSION(S) / ED DIAGNOSES  Final diagnoses:  Muscle spasm     ED Discharge Orders         Ordered    diazepam (VALIUM) 5 MG tablet  1 Day/Dose     10/05/17 1128           Note:  This document was prepared using Dragon voice recognition software and may include  unintentional dictation errors.    Arnaldo NatalMalinda, Paul F, MD 10/05/17 [phone]

## 2017-10-05 NOTE — ED Notes (Signed)
Wife came to Nurses station requesting pillow and pain meds. This RN gave pt pillow and explained to the wife whom speaks English that we were waiting on the translator to return and that medication could be discussed with EDP at that time. RN will monitor.

## 2017-10-05 NOTE — ED Triage Notes (Signed)
Pt reports pain worse with sitting

## 2017-10-05 NOTE — Discharge Instructions (Addendum)
Please take 3 of the over-the-counter Motrin 3 times a day for 3 more days.  Take them with food so they do not upset your stomach.  Take the Valium 1 pill in the morning every day until you run out.  Do not take it in the evening of GERD taking the temazepam for sleep and it may be too much.  Light duty for a week or no work at all for 3 days.  Please follow-up with your regular doctor and have them get you some physical therapy or massage.  Use a heating pad at least 3 times a day to the area but do not fall asleep on it because you can get burns.  Please return if you are worse.

## 2017-10-05 NOTE — ED Triage Notes (Signed)
Per interpreter pt with upper back pain, into neck for the past couple of weeks. Was seen at [facility] and prescribed prednisone, hydrocodone and tizanidine. Pt reports he is still having pain. 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 Pt feels it may have something to do with the work he does but is not sure. He does not wish to file WC today. Pt states that he has felt pressure in his chest once as well.

## 2017-10-05 NOTE — ED Notes (Addendum)
First Nurse Note: [facility] pain.  [facility], here today because of 10/10 pain without improvement from PO meds.  Patient speaks Spanish, Interpreter requested.

## 2017-10-27 ENCOUNTER — Ambulatory Visit: Payer: 59 | Admitting: Podiatry

## 2017-11-17 ENCOUNTER — Encounter (INDEPENDENT_AMBULATORY_CARE_PROVIDER_SITE_OTHER): Payer: 59 | Admitting: Podiatry

## 2017-11-17 ENCOUNTER — Ambulatory Visit: Payer: 59

## 2017-11-17 DIAGNOSIS — M7672 Peroneal tendinitis, left leg: Secondary | ICD-10-CM

## 2017-11-17 DIAGNOSIS — M722 Plantar fascial fibromatosis: Secondary | ICD-10-CM

## 2017-11-17 NOTE — Progress Notes (Signed)
This encounter was created in error - please disregard.

## 2017-12-15 ENCOUNTER — Encounter: Payer: 59 | Admitting: Podiatry

## 2017-12-28 ENCOUNTER — Ambulatory Visit: Payer: 59 | Admitting: Urology

## 2018-01-04 NOTE — Progress Notes (Signed)
This encounter was created in error - please disregard.

## 2018-01-12 ENCOUNTER — Ambulatory Visit: Payer: 59 | Admitting: Urology

## 2018-01-12 ENCOUNTER — Encounter: Payer: Self-pay | Admitting: Urology

## 2018-09-14 ENCOUNTER — Emergency Department: Payer: 59

## 2018-09-14 ENCOUNTER — Encounter: Payer: Self-pay | Admitting: Emergency Medicine

## 2018-09-14 ENCOUNTER — Emergency Department
Admission: EM | Admit: 2018-09-14 | Discharge: 2018-09-14 | Disposition: A | Discharge status: Home / Self Care | Payer: 59 | Attending: Emergency Medicine | Admitting: Emergency Medicine

## 2018-09-14 ENCOUNTER — Other Ambulatory Visit: Payer: Self-pay

## 2018-09-14 DIAGNOSIS — I10 Essential (primary) hypertension: Secondary | ICD-10-CM | POA: Diagnosis not present

## 2018-09-14 DIAGNOSIS — Z79899 Other long term (current) drug therapy: Secondary | ICD-10-CM | POA: Insufficient documentation

## 2018-09-14 DIAGNOSIS — I2699 Other pulmonary embolism without acute cor pulmonale: Secondary | ICD-10-CM | POA: Insufficient documentation

## 2018-09-14 DIAGNOSIS — R0789 Other chest pain: Secondary | ICD-10-CM | POA: Diagnosis not present

## 2018-09-14 DIAGNOSIS — R079 Chest pain, unspecified: Secondary | ICD-10-CM | POA: Diagnosis present

## 2018-09-14 LAB — BASIC METABOLIC PANEL WITH GFR
Anion gap: 7 (ref 5–15)
BUN: 18 mg/dL (ref 6–20)
CO2: 25 mmol/L (ref 22–32)
Calcium: 9.3 mg/dL (ref 8.9–10.3)
Chloride: 109 mmol/L (ref 98–111)
Creatinine, Ser: 0.82 mg/dL (ref 0.61–1.24)
GFR calc Af Amer: >60 mL/min
GFR calc non Af Amer: >60 mL/min
Glucose, Bld: 109 mg/dL — ABNORMAL HIGH (ref 70–99)
Potassium: 3.8 mmol/L (ref 3.5–5.1)
Sodium: 141 mmol/L (ref 135–145)

## 2018-09-14 LAB — CBC
HCT: 44.7 % (ref 39.0–52.0)
Hemoglobin: 15.1 g/dL (ref 13.0–17.0)
MCH: 28.2 pg (ref 26.0–34.0)
MCHC: 33.8 g/dL (ref 30.0–36.0)
MCV: 83.4 fL (ref 80.0–100.0)
Platelets: 218 10*3/uL (ref 150–400)
RBC: 5.36 MIL/uL (ref 4.22–5.81)
RDW: 13.4 % (ref 11.5–15.5)
WBC: 5.2 10*3/uL (ref 4.0–10.5)
nRBC: 0 % (ref 0.0–0.2)

## 2018-09-14 LAB — TROPONIN I (HIGH SENSITIVITY)
Troponin I (High Sensitivity): 3 ng/L
Troponin I (High Sensitivity): 3 ng/L

## 2018-09-14 LAB — FIBRIN DERIVATIVES D-DIMER (ARMC ONLY): Fibrin derivatives D-dimer (ARMC): 971.68 ng/mL (FEU) — ABNORMAL HIGH (ref 0.00–499.00)

## 2018-09-14 MED ORDER — IOHEXOL 350 MG/ML SOLN
75.0000 mL | Freq: Once | INTRAVENOUS | Status: AC | PRN
  Administered 2018-09-14: 75 mL via INTRAVENOUS

## 2018-09-14 MED ORDER — RIVAROXABAN 15 MG PO TABS
15.0000 mg | ORAL_TABLET | Freq: Once | ORAL | Status: AC
  Administered 2018-09-14: 15 mg via ORAL
  Filled 2018-09-14: qty 1

## 2018-09-14 MED ORDER — KETOROLAC TROMETHAMINE 30 MG/ML IJ SOLN
30.0000 mg | Freq: Once | INTRAMUSCULAR | Status: AC
  Administered 2018-09-14: 30 mg via INTRAVENOUS
  Filled 2018-09-14: qty 1

## 2018-09-14 MED ORDER — RIVAROXABAN (XARELTO) VTE STARTER PACK (15 & 20 MG): ORAL_TABLET | ORAL | 0 refills | Status: DC

## 2018-09-14 NOTE — ED Provider Notes (Addendum)
[facility] Emergency Department Provider Note  ____________________________________________   I have reviewed the triage vital signs and the nursing notes. Where available I have reviewed prior notes and, if possible and indicated, outside hospital notes.   Patient seen and evaluated during the coronavirus epidemic during a time with low staffing  Patient seen for the symptoms described in the history of present illness. She was evaluated in the context of the global COVID-19 pandemic, which necessitated consideration that the patient might be at risk for infection with the SARS-CoV-2 virus that causes COVID-19. Institutional protocols and algorithms that pertain to the evaluation of patients at risk for COVID-19 are in a state of rapid change based on information released by regulatory bodies including the CDC and federal and state organizations. These policies and algorithms were followed during the patient's care in the ED.    HISTORY  Chief Complaint Chest Pain    HPI Allen Chapman is a 58 y.o. male with a history of recurrent episodes of chest wall pain, chronic insomnia, anxiety, among other medical problems presents today complaining of a pain in his right chest wall in the muscles of his upper back.  He states that it hurts when he lifts heavy boxes at his job.  No recollected trauma.  Hurts all the time for last 3 days without stop but worse when he changes position, lift his arm, or touches his chest.  No shortness of breath no pleuritic pain, no personal family history of PE or DVT, he has had no nausea no vomiting, he has no exertional symptoms no personal family history of ACS that he knows of.  Patient has had no leg swelling no recent travel.  He denies any fever or chills.  Pain began gradually after he lifted some boxes, they were heavier than normal a few days ago.  Has been present ever since.  Recollected therefore initiation.  He was not at maximum  intensity at onset nor was it immediately tearing towards his back.  It does involve the muscles around the right arm including the chest and the trapezius muscle.  No stiff neck no headache.  No numbness no tingling no weakness.  Patient states that he has had pain exactly like this multiple times he has been worked up for this in the past as an outpatient as an inpatient he states.  Patient does speak [place].  I have offered him interpreter but he is very comfortable with my Spanish would prefer not to have anyone else in the room.  States he is also been under a great deal of emotional stress and feels that he might be slightly depressed but notes no SI or HI and contracts for safety.  His sister had recent surgery which apparently went well but caused him a great deal of stress.   Past Medical History:  Diagnosis Date  . Erectile dysfunction 07/02/2014  . GERD (gastroesophageal reflux disease)   . Hematuria, microscopic 07/02/2014  . Hypogonadism in male 07/02/2014  . Inguinal hernia, left 07/02/2014  . Kidney stone   . Prostate enlargement     Patient Active Problem List   Diagnosis Date Noted  . HTN (hypertension) 09/10/2015  . Plantar fasciitis of left foot 09/10/2015  . Chest wall pain 04/03/2015  . Gastritis and gastroduodenitis 04/03/2015  . Insomnia 04/03/2015  . Snoring 04/03/2015  . Chronic insomnia 10/31/2014  . Elevated BP 10/31/2014  . Gastroesophageal reflux disease without esophagitis 10/31/2014  . Erectile dysfunction 07/02/2014  .  Hypogonadism in male 07/02/2014  . Difficult or painful urination 05/19/2014  . Enlarged prostate 05/19/2014  . ED (erectile dysfunction) of organic origin 05/19/2014  . 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 Hernia, inguinal 05/19/2014  . Hematuria, microscopic 05/19/2014  . Prostatitis 05/19/2014  . Calculus of kidney 05/19/2014  . Diarrhea 01/17/2014  . Excessive urination at night 07/01/2012  . Must strain to pass urine 07/01/2012  .  Urgency of micturation 07/01/2012  . FOM (frequency of micturition) 07/01/2012    Past Surgical History:  Procedure Laterality Date  . APPENDECTOMY  1980  . CYSTOSCOPY  03/31/2014  . EXTRACORPOREAL SHOCK WAVE LITHOTRIPSY Left 10/10/2015   Procedure: EXTRACORPOREAL SHOCK WAVE LITHOTRIPSY (ESWL);  Surgeon: Hildred LaserBrian Charistopher Rumble Budzyn, MD;  Location: [facility] ORS;  Service: Urology;  Laterality: Left;  . LITHOTRIPSY      Prior to Admission medications   Medication Sig Start Date End Date Taking? Authorizing Provider  doxazosin (CARDURA) 2 MG tablet Take 1 tablet by mouth daily. 08/26/17   [provider]  omeprazole (PRILOSEC) 40 MG capsule Take 40 mg by mouth daily.    [provider]  predniSONE (DELTASONE) 20 MG tablet Take 1 tablet by mouth 2 (two) times daily. 09/29/17   [provider]  temazepam (RESTORIL) 15 MG capsule Take 15 mg by mouth at bedtime. 07/21/15   [provider]  tiZANidine (ZANAFLEX) 4 MG tablet Take 1 tablet by mouth 3 (three) times daily. 09/29/17   [provider]    Allergies Nortriptyline and Flomax [tamsulosin hcl]  Family History  Problem Relation Age of Onset  . Bladder Cancer Neg Hx   . Kidney cancer Neg Hx   . Prostate cancer Neg Hx     Social History Social History   Tobacco Use  . Smoking status: Never Smoker  . Smokeless tobacco: Never Used  Substance Use Topics  . Alcohol use: No    Alcohol/week: 0.0 standard drinks  . Drug use: No    Review of Systems Constitutional: No fever/chills Eyes: No visual changes. ENT: No sore throat. No stiff neck no neck pain Cardiovascular: + chest pain. Respiratory: Denies shortness of breath. Gastrointestinal:   no vomiting.  No diarrhea.  No constipation. Genitourinary: Negative for dysuria. Musculoskeletal: Negative lower extremity swelling Skin: Negative for rash. Neurological: Negative for severe headaches, focal weakness or  numbness.   ____________________________________________   PHYSICAL EXAM:  VITAL SIGNS: ED Triage Vitals  Enc Vitals Group     BP 09/14/18 1521 (!) 146/93     Pulse Rate 09/14/18 1521 86     Resp --      Temp 09/14/18 1521 99.2 F (37.3 C)     Temp Source 09/14/18 1521 Oral     SpO2 09/14/18 1521 96 %     Weight 09/14/18 1522 165 lb (74.8 kg)     Height 09/14/18 1522 5\' 6"  (1.676 m)     Head Circumference --      Peak Flow --      Pain Score 09/14/18 1522 8     Pain Loc --      Pain Edu? --      Excl. in GC? --     Constitutional: Alert and oriented. Well appearing and in no acute distress. Eyes: Conjunctivae are normal Head: Atraumatic HEENT: No congestion/rhinnorhea. Mucous membranes are moist.  Oropharynx non-erythematous Neck:   Nontender with no meningismus, no masses, no stridor Chest: Tender palpation noted to the right chest wall as well as the  trapezius muscle, also when he moves his arm or pulls up in the bed he states it hurts.  When I touch this area he states "accessed the pain right there" and pulls back.  No crepitus no flail chest no lesions noted. Cardiovascular: Normal rate, regular rhythm. Grossly normal heart sounds.  Good peripheral circulation. Respiratory: Normal respiratory effort.  No retractions. Lungs CTAB. Abdominal: Soft and nontender. No distention. No guarding no rebound Back:  There is no focal tenderness or step off.  there is no midline tenderness there are no lesions noted. there is no CVA tenderness Musculoskeletal: No lower extremity tenderness, no upper extremity tenderness. No joint effusions, no DVT signs strong distal pulses no edema Neurologic:  Normal speech and language. No gross focal neurologic deficits are appreciated.  Skin:  Skin is warm, dry and intact. No rash noted. Psychiatric: Mood and affect are anxious. Speech and behavior are normal.  ____________________________________________   LABS (all labs ordered are listed,  but only abnormal results are displayed)  Labs Reviewed  BASIC METABOLIC PANEL - Abnormal; Notable for the following components:      Result Value   Glucose, Bld 109 (*)    All other components within normal limits  CBC  FIBRIN DERIVATIVES D-DIMER ([facility] ONLY)  TROPONIN I (HIGH SENSITIVITY)  TROPONIN I (HIGH SENSITIVITY)    Pertinent labs  results that were available during my care of the patient were reviewed by me and considered in my medical decision making (see chart for details). ____________________________________________  EKG  I personally interpreted any EKGs ordered by me or triage Patient with normal sinus rhythm, rate 85 bpm, borderline right bundle branch block.  No acute ST elevation or depression consistent with ischemia ____________________________________________  RADIOLOGY  Pertinent labs & imaging results that were available during my care of the patient were reviewed by me and considered in my medical decision making (see chart for details). If possible, patient and/or family made aware of any abnormal findings.  Dg Chest 2 View  Result Date: 09/14/2018 CLINICAL DATA:  Chest pain EXAM: CHEST - 2 VIEW COMPARISON:  10/05/2017 FINDINGS: The heart size and mediastinal contours are within normal limits. Both lungs are clear. The visualized skeletal structures are unremarkable. IMPRESSION: No acute abnormality of the lungs. Electronically Signed   By: Lauralyn PrimesAlex  Bibbey M.D.   On: 09/14/2018 16:03   ____________________________________________    PROCEDURES  Procedure(s) performed: None  Procedures  Critical Care performed: None  ____________________________________________   INITIAL IMPRESSION / ASSESSMENT AND PLAN / ED COURSE  Pertinent labs & imaging results that were available during my care of the patient were reviewed by me and considered in my medical decision making (see chart for details).   Patient is in no acute distress, has had 3 days of very  reproducible very positional chest wall pain consistent with multiple prior episodes.  Nothing to suggest ACS PE or dissection.  Nonetheless, even though his first troponin is negative after 3 days we will send a second cardiac marker, low suspicion for PE but I will send a d-dimer, if all that is negative is my hope that we get the patient safely home.  Very clearly to me seems most consistent with reproducible chest wall pain in the muscles that he uses at work.  Patient himself feels that this is likely what it is but he states he was anxious and wanted it checked out.  ----------------------------------------- 8:05 PM on 09/14/2018 -----------------------------------------  Pt in nad pain much better  but d dimer postive, signed out out to dr. Derrill KayGoodman at the end of my shift. If ct neg anticipate likely d.c.   ____________________________________________   FINAL CLINICAL IMPRESSION(S) / ED DIAGNOSES  Final diagnoses:  None      This chart was dictated using voice recognition software.  Despite best efforts to proofread,  errors can occur which can change meaning.      Jeanmarie PlantMcShane, Marin Milley A, MD 09/14/18 1736    Jeanmarie PlantMcShane, Latoyia Tecson A, MD 09/14/18 2006

## 2018-09-14 NOTE — ED Notes (Signed)
Triage completed via medical interpreter.

## 2018-09-14 NOTE — ED Triage Notes (Signed)
Pt in via POV, reports centralized chest pain with radiation through to back x 3 days.  Denies associating symptoms.  Vitals WDL, NAD noted at this time.

## 2018-09-14 NOTE — Discharge Instructions (Addendum)
Please seek medical attention for any high fevers, chest pain, shortness of breath, change in behavior, persistent vomiting, bloody stool or any other new or concerning symptoms.  

## 2018-09-14 NOTE — ED Notes (Signed)
First RN Note: Pt presents to ED via wheelchair from [facility] with c/o CP x 3-4 days, worse today with pain 8/10. Per [facility] RN pt denies SOB at this time. Interpreter requested by this RN. Pt appears alert and oriented, skin warm, dry, and intact.

## 2018-09-14 NOTE — ED Provider Notes (Signed)
Patient CT did come back positive for PEs. Using hospital interpreter I discussed this with the patient. Did offer admission to start patient on anticoagulation however he stated he would rather go home. At this point I think it is reasonable. Patient not hypoxic or with increased work of breathing. Had a discussion with the patient about strict return and bleeding precautions. Discussed importance of return for any shortness of breath or worsening chest pain. Discussed with patient importance of close follow up with PCP.    Nance Pear, MD 09/14/18 2249

## 2018-09-26 ENCOUNTER — Other Ambulatory Visit: Payer: Self-pay | Admitting: Family Medicine

## 2018-09-26 DIAGNOSIS — R61 Generalized hyperhidrosis: Secondary | ICD-10-CM

## 2018-09-26 DIAGNOSIS — I2699 Other pulmonary embolism without acute cor pulmonale: Secondary | ICD-10-CM

## 2018-09-28 ENCOUNTER — Ambulatory Visit: Payer: 59

## 2018-10-07 ENCOUNTER — Ambulatory Visit
Admission: RE | Admit: 2018-10-07 | Discharge: 2018-10-07 | Disposition: A | Discharge status: Home / Self Care | Payer: 59 | Source: Ambulatory Visit | Attending: Family Medicine | Admitting: Family Medicine

## 2018-10-07 ENCOUNTER — Other Ambulatory Visit: Payer: Self-pay

## 2018-10-07 DIAGNOSIS — I2699 Other pulmonary embolism without acute cor pulmonale: Secondary | ICD-10-CM | POA: Insufficient documentation

## 2018-10-07 DIAGNOSIS — R61 Generalized hyperhidrosis: Secondary | ICD-10-CM

## 2018-10-07 MED ORDER — IOHEXOL 300 MG/ML  SOLN
100.0000 mL | Freq: Once | INTRAMUSCULAR | Status: AC | PRN
  Administered 2018-10-07: 100 mL via INTRAVENOUS

## 2018-12-07 ENCOUNTER — Encounter: Payer: Self-pay | Admitting: Urology

## 2018-12-07 ENCOUNTER — Other Ambulatory Visit: Payer: Self-pay

## 2018-12-07 ENCOUNTER — Ambulatory Visit: Payer: 59 | Admitting: Urology

## 2018-12-07 VITALS — BP 175/103 | HR 106 | Ht 66.0 in | Wt 169.0 lb

## 2018-12-07 DIAGNOSIS — N138 Other obstructive and reflux uropathy: Secondary | ICD-10-CM | POA: Diagnosis not present

## 2018-12-07 DIAGNOSIS — N401 Enlarged prostate with lower urinary tract symptoms: Secondary | ICD-10-CM

## 2018-12-07 DIAGNOSIS — N411 Chronic prostatitis: Secondary | ICD-10-CM | POA: Diagnosis not present

## 2018-12-07 LAB — BLADDER SCAN AMB NON-IMAGING

## 2018-12-07 MED ORDER — DOXAZOSIN MESYLATE 4 MG PO TABS: 4.0000 mg | ORAL_TABLET | Freq: Every day | ORAL | 11 refills | Status: DC

## 2018-12-07 NOTE — Progress Notes (Signed)
12/07/2018 9:21 PM   Allen Chapman 1960-03-10 [id number]  Referring provider: [place] [address] [place],  [place] [address]  Chief Complaint  Patient presents with  . Benign Prostatic Hypertrophy    New patient    HPI: 58 year old male who presents today for further evaluation of severe urinary symptoms.  He is accompanied today by [place] translator, Ronnald Collum.  The patient reports a long history of obstructive urinary symptoms including weak stream, hesitancy and nocturia.  He tried multiple medications in the past including finasteride which significantly impaired his libido.  He is also tried Flomax on several occasions for which he has dizziness and low blood pressure.  It appears at some point he was prescribed doxazosin but the patient cannot recall whether or not this was tolerated.  More recently over the past 2 months, his symptoms have become worse and with increased urinary frequency and urgency.  He also now reports experiencing penile pain near the end of voiding and just after voiding which last for several minutes, up to 3 to 4 minutes which is severe.  He denies any urethral discharge.  He denies any associated fevers chills or flank pain.  He does have a personal history of bilateral pulmonary emboli diagnosed in 08/2018.  He is recently started on anticoagulation and has been referred to hematology for further evaluation of this.  He is also has some arm pain and was started on prednisone for this just yesterday.  Patient has been noted to have significant prostamegaly on CT scan.  Estimated prostate volume based on these calculations approximately 59 cc (5.1 x 5.3 x 4.2 cm).  Notably, he underwent CT abdomen pelvis with contrast on 10/07/2018 to rule out malignancy.  There are no significant findings other than prostamegaly.  Most recent PSA 0.43.   IPSS    Row Name 12/07/18 0900         International Prostate Symptom Score   How often have you  had the sensation of not emptying your bladder?  Almost always     How often have you had to urinate less than every two hours?  Almost always     How often have you found you stopped and started again several times when you urinated?  More than half the time     How often have you found it difficult to postpone urination?  About half the time     How often have you had a weak urinary stream?  Less than half the time     How often have you had to strain to start urination?  Less than half the time     How many times did you typically get up at night to urinate?  5 Times     Total IPSS Score  26       Quality of Life due to urinary symptoms   If you were to spend the rest of your life with your urinary condition just the way it is now how would you feel about that?  Unhappy        Score:  1-7 Mild 8-19 Moderate 20-35 Severe  PMH: Past Medical History:  Diagnosis Date  . Erectile dysfunction 07/02/2014  . GERD (gastroesophageal reflux disease)   . Hematuria, microscopic 07/02/2014  . Hypogonadism in male 07/02/2014  . Inguinal hernia, left 07/02/2014  . Kidney stone   . Prostate enlargement     Surgical History: Past Surgical History:  Procedure Laterality Date  .  APPENDECTOMY  1980  . CYSTOSCOPY  03/31/2014  . EXTRACORPOREAL SHOCK WAVE LITHOTRIPSY Left 10/10/2015   Procedure: EXTRACORPOREAL SHOCK WAVE LITHOTRIPSY (ESWL);  Surgeon: Hildred LaserBrian James Budzyn, MD;  Location: [facility] ORS;  Service: Urology;  Laterality: Left;  . LITHOTRIPSY      Home Medications:  Allergies as of 12/07/2018      Reactions   Nortriptyline Shortness Of Breath   Flomax [tamsulosin Hcl] Other (See Comments)   Dizziness      Medication List       Accurate as of December 07, 2018  9:21 PM. If you have any questions, ask your nurse or doctor.        STOP taking these medications   Rivaroxaban 15 & 20 MG Tbpk Stopped by: Vanna ScotlandAshley Annalyssa Thune, MD   tiZANidine 4 MG tablet Commonly known as: ZANAFLEX  Stopped by: Vanna ScotlandAshley Caretha Rumbaugh, MD     TAKE these medications   doxazosin 4 MG tablet Commonly known as: CARDURA Take 1 tablet (4 mg total) by mouth daily. What changed:   medication strength  how much to take Changed by: Vanna ScotlandAshley Braeton Wolgamott, MD   omeprazole 40 MG capsule Commonly known as: PRILOSEC Take 40 mg by mouth daily.   predniSONE 10 MG (21) Tbpk tablet Commonly known as: STERAPRED UNI-PAK 21 TAB What changed: Another medication with the same name was removed. Continue taking this medication, and follow the directions you see here. Changed by: Vanna ScotlandAshley Naida Escalante, MD   temazepam 15 MG capsule Commonly known as: RESTORIL Take 15 mg by mouth at bedtime.       Allergies:  Allergies  Allergen Reactions  . Nortriptyline Shortness Of Breath  . Flomax [Tamsulosin Hcl] Other (See Comments)    Dizziness    Family History: Family History  Problem Relation Age of Onset  . Bladder Cancer Neg Hx   . Kidney cancer Neg Hx   . Prostate cancer Neg Hx     Social History:  reports that he has never smoked. He has never used smokeless tobacco. He reports that he does not drink alcohol or use drugs.  ROS: UROLOGY Frequent Urination?: Yes Hard to postpone urination?: Yes Burning/pain with urination?: Yes Get up at night to urinate?: No Leakage of urine?: Yes Urine stream starts and stops?: No Trouble starting stream?: No Do you have to strain to urinate?: No Blood in urine?: No Urinary tract infection?: Yes Sexually transmitted disease?: No Injury to kidneys or bladder?: No Painful intercourse?: No Weak stream?: Yes Erection problems?: Yes Penile pain?: Yes  Gastrointestinal Nausea?: No Vomiting?: No Indigestion/heartburn?: No Diarrhea?: No Constipation?: No  Constitutional Fever: No Night sweats?: No Weight loss?: No Fatigue?: No  Skin Skin rash/lesions?: No  Eyes Blurred vision?: No Double vision?: No  Ears/Nose/Throat Sore throat?: No Sinus problems?:  No  Hematologic/Lymphatic Swollen glands?: No Easy bruising?: No  Cardiovascular Leg swelling?: No Chest pain?: No  Respiratory Cough?: No Shortness of breath?: No  Endocrine Excessive thirst?: No  Musculoskeletal Back pain?: No Joint pain?: No  Neurological Headaches?: No Dizziness?: No  Psychologic Depression?: Yes Anxiety?: No  Physical Exam: BP (!) 175/103   Pulse (!) 106   Ht 5\' 6"  (1.676 m)   Wt 169 lb (76.7 kg)   BMI 27.28 kg/m   Constitutional:  Alert and oriented, No acute distress. HEENT: [place] AT, moist mucus membranes.  Trachea midline, no masses. Cardiovascular: No clubbing, cyanosis, or edema. Respiratory: Normal respiratory effort, no increased work of breathing. GI: Abdomen is soft, nontender, nondistended,  no abdominal masses GU: Normal circumcised phallus with orthotopic meatus, no urethral discharge.  Bilateral descended testicles, normal no masses. Rectal: Normal sphincter tone.  Enlarged prostate, 50+ cc, rubbery without nodules, tenderness to palpation diffusely.  No bogginess. Skin: No rashes, bruises or suspicious lesions. Neurologic: Grossly intact, no focal deficits, moving all 4 extremities. Psychiatric: Normal mood and affect.  Laboratory Data: Lab Results  Component Value Date   WBC 5.2 09/14/2018   HGB 15.1 09/14/2018   HCT 44.7 09/14/2018   MCV 83.4 09/14/2018   PLT 218 09/14/2018    Lab Results  Component Value Date   CREATININE 0.82 09/14/2018    Urinalysis Urinalysis reviewed today, completely negative.  Pertinent Imaging: CT abdomen pelvis from 09/2018 was personally reviewed, agree with radiologic interpretation.  Prostate volume calculated as above.  Assessment & Plan:    1. BPH with obstruction/lower urinary tract symptoms Baseline obstructive urinary symptoms, poorly controlled with more acute irritative voiding symptoms  No evidence of bacterial infection, will send off chlamydia and gonorrhea to rule out STI  though not suspected.  We discussed given his recent pulmonary emboli and need for at least 6 months of continuous anticoagulation, he is not currently a surgical candidate.  Prefer that he follow-up with hematology to complete his hematological evaluation and may be a surgical candidate early next year depending on their evaluation.  In the interim, plan to maximize pharmacotherapy.  I understand the side effects of decreased libido with finasteride but given the size of the gland, would likely benefit from this medication at least in the short-term.  He will resume this medication.  In addition to the above, will try doxazosin as he has been unable to tolerate Flomax.  Patient recently started on prednisone yesterday which also may help if there is any inflammatory component to his urinary symptoms.  Unable to take NSAIDs secondary to anticoagulation at this time.  Limited role for antibiotics given this likely an inflammatory condition.  We will plan to start preoperative evaluation including cystoscopy in January.  He is agreeable this plan.  He was advised to call if his symptoms have no improvement in 1 month. - Urinalysis, Complete - Bladder Scan (Post Void Residual) in office  2. Chronic prostatitis As abov - C/Chlamydia Probe Amp([facility])    Return for cysto in Jan.  Vanna Scotland, MD  [facility] [address] [place], [place] [address] [phone]

## 2018-12-08 LAB — URINALYSIS, COMPLETE
Bilirubin, UA: NEGATIVE
Glucose, UA: NEGATIVE
Ketones, UA: NEGATIVE
Leukocytes,UA: NEGATIVE
Nitrite, UA: NEGATIVE
Protein,UA: NEGATIVE
RBC, UA: NEGATIVE
Specific Gravity, UA: >1.03 — ABNORMAL HIGH (ref 1.005–1.030)
Urobilinogen, Ur: 0.2 mg/dL (ref 0.2–1.0)
pH, UA: 5 (ref 5.0–7.5)

## 2018-12-08 LAB — MICROSCOPIC EXAMINATION: RBC, Urine: NONE SEEN /hpf (ref 0–2)

## 2018-12-08 LAB — GC/CHLAMYDIA PROBE AMP
Chlamydia trachomatis, NAA: NEGATIVE
Neisseria Gonorrhoeae by PCR: NEGATIVE

## 2018-12-19 ENCOUNTER — Telehealth: Payer: Self-pay | Admitting: Urology

## 2018-12-19 MED ORDER — ALFUZOSIN HCL ER 10 MG PO TB24: 10.0000 mg | ORAL_TABLET | Freq: Every day | ORAL | 3 refills | Status: DC

## 2018-12-19 NOTE — Telephone Encounter (Signed)
Spoke with wife regarding side effects-he stopped taking Cardura last week-symptoms improved. Would like to know if there was anything else he could be prescribed?

## 2018-12-19 NOTE — Telephone Encounter (Signed)
Patient's wife called and left a voicemail stating that the medication he was given is not working that he was still burning and it was affecting his vision and he has stopped taking it. She did not say what it was. He wants to know if there is something else he can take? Please call her back @ [phone].   Sharyn Lull

## 2018-12-19 NOTE — Telephone Encounter (Signed)
Spoke with wife  Georgann Housekeeper regarding medication. He has not been taking finasteride, could not tolerate due to side effects. Would like to start Uroxatrol-sent in to pharmacy as requested.

## 2018-12-19 NOTE — Telephone Encounter (Signed)
Please ensure that he still taking finasteride at minimum.    He is now failed Flomax and Cardura secondary to side effects.  He can try Uroxatrol as an alternative.  Hollice Espy, MD

## 2018-12-24 ENCOUNTER — Other Ambulatory Visit: Payer: Self-pay

## 2018-12-24 DIAGNOSIS — Z79899 Other long term (current) drug therapy: Secondary | ICD-10-CM | POA: Insufficient documentation

## 2018-12-24 DIAGNOSIS — Z7901 Long term (current) use of anticoagulants: Secondary | ICD-10-CM | POA: Insufficient documentation

## 2018-12-24 DIAGNOSIS — R531 Weakness: Secondary | ICD-10-CM | POA: Diagnosis not present

## 2018-12-24 DIAGNOSIS — I10 Essential (primary) hypertension: Secondary | ICD-10-CM | POA: Insufficient documentation

## 2018-12-24 DIAGNOSIS — R55 Syncope and collapse: Secondary | ICD-10-CM | POA: Insufficient documentation

## 2018-12-24 LAB — CBC
HCT: 42.6 % (ref 39.0–52.0)
Hemoglobin: 14.5 g/dL (ref 13.0–17.0)
MCH: 27.8 pg (ref 26.0–34.0)
MCHC: 34 g/dL (ref 30.0–36.0)
MCV: 81.6 fL (ref 80.0–100.0)
Platelets: 185 10*3/uL (ref 150–400)
RBC: 5.22 MIL/uL (ref 4.22–5.81)
RDW: 13.2 % (ref 11.5–15.5)
WBC: 4.3 10*3/uL (ref 4.0–10.5)
nRBC: 0 % (ref 0.0–0.2)

## 2018-12-24 LAB — BASIC METABOLIC PANEL
Anion gap: 8 (ref 5–15)
BUN: 20 mg/dL (ref 6–20)
CO2: 23 mmol/L (ref 22–32)
Calcium: 8.8 mg/dL — ABNORMAL LOW (ref 8.9–10.3)
Chloride: 110 mmol/L (ref 98–111)
Creatinine, Ser: 0.69 mg/dL (ref 0.61–1.24)
GFR calc Af Amer: >60 mL/min (ref >60)
GFR calc non Af Amer: >60 mL/min (ref >60)
Glucose, Bld: 162 mg/dL — ABNORMAL HIGH (ref 70–99)
Potassium: 3.4 mmol/L — ABNORMAL LOW (ref 3.5–5.1)
Sodium: 141 mmol/L (ref 135–145)

## 2018-12-24 MED ORDER — SODIUM CHLORIDE 0.9% FLUSH: 3.0000 mL | Freq: Once | INTRAVENOUS | Status: DC

## 2018-12-24 NOTE — ED Triage Notes (Signed)
Spanish speaking patient to ED via EMS for sudden onset of weakness and near syncope. Denies falling or striking his head. States he has been watching television today and when he got up to "pee he felt very weak." Patient denies known exposure to Covid. Denies CP and also denies respiratory distress.

## 2018-12-24 NOTE — ED Notes (Signed)
Spanish speaking only. Takes Xarelto. Had syncopal episode while walking to the bathroom. CBG 140s, HR 80s. BP 148/96. Witnessed event at home.

## 2018-12-25 ENCOUNTER — Emergency Department
Admission: EM | Admit: 2018-12-25 | Discharge: 2018-12-25 | Disposition: A | Discharge status: Home / Self Care | Payer: 59 | Attending: Emergency Medicine | Admitting: Emergency Medicine

## 2018-12-25 ENCOUNTER — Emergency Department: Payer: 59

## 2018-12-25 DIAGNOSIS — R55 Syncope and collapse: Secondary | ICD-10-CM

## 2018-12-25 LAB — URINALYSIS, COMPLETE (UACMP) WITH MICROSCOPIC
Bacteria, UA: NONE SEEN
Bilirubin Urine: NEGATIVE
Glucose, UA: NEGATIVE mg/dL
Hgb urine dipstick: NEGATIVE
Ketones, ur: NEGATIVE mg/dL
Leukocytes,Ua: NEGATIVE
Nitrite: NEGATIVE
Protein, ur: NEGATIVE mg/dL
Specific Gravity, Urine: 1.02 (ref 1.005–1.030)
pH: 5 (ref 5.0–8.0)

## 2018-12-25 LAB — TROPONIN I (HIGH SENSITIVITY): Troponin I (High Sensitivity): 3 ng/L (ref <18)

## 2018-12-25 MED ORDER — METOCLOPRAMIDE HCL 5 MG/ML IJ SOLN
10.0000 mg | Freq: Once | INTRAMUSCULAR | Status: AC
  Administered 2018-12-25: 10 mg via INTRAVENOUS
  Filled 2018-12-25: qty 2

## 2018-12-25 MED ORDER — ACETAMINOPHEN 500 MG PO TABS
1000.0000 mg | ORAL_TABLET | Freq: Once | ORAL | Status: AC
  Administered 2018-12-25: 1000 mg via ORAL
  Filled 2018-12-25: qty 2

## 2018-12-25 MED ORDER — SODIUM CHLORIDE 0.9 % IV BOLUS
1000.0000 mL | Freq: Once | INTRAVENOUS | Status: AC
  Administered 2018-12-25: 1000 mL via INTRAVENOUS

## 2018-12-25 NOTE — ED Notes (Signed)
Pt to CT via stretcher

## 2018-12-25 NOTE — ED Notes (Signed)
Pt back from CT

## 2018-12-25 NOTE — ED Notes (Signed)
Dr. Veronese at bedside 

## 2018-12-25 NOTE — ED Notes (Signed)
Pt's wife assisted him with urinal; will send specimen to lab

## 2018-12-25 NOTE — ED Provider Notes (Signed)
[facility] Emergency Department Provider Note  ____________________________________________  Time seen: Approximately 4:03 AM  I have reviewed the triage vital signs and the nursing notes.   HISTORY  Chief Complaint Weakness and Near Syncope   HPI Allen Chapman is a 58 y.o. male history of PE on Xarelto, hypertension, BPH, headaches who presents for evaluation of a near syncopal episode.  Patient reports being in his usual state of health when he was watching TV this evening with his wife.  He got up to go to the bathroom.  While urinating he had some burning and he started to feel dizzy.  He reports tunnel vision and feeling like he was going to pass out.  He never fully lost consciousness.  He did not fall or injure himself.  After this episode he developed a headache which progressed over the course of several hours.  He does report a history of chronic headaches and has them several times a week.  This headache is identical to his chronic headaches.  He describes as a 9 out of 10, diffuse and throbbing.  He denies chest pain or shortness of breath, cough, body aches, fever, sore throat, loss of taste or smell, leg pain or swelling, hemoptysis, abdominal pain, nausea, vomiting, diarrhea.  He does report dysuria intermittently for the last 2 months for which he is followed by urology.  He denies back pain.  He denies ever having similar episode.  He reports eating and drinking normally.   Endorses compliance with Xarelto and has not missed any doses since being started on it.  Past Medical History:  Diagnosis Date   Erectile dysfunction 07/02/2014   GERD (gastroesophageal reflux disease)    Hematuria, microscopic 07/02/2014   Hypogonadism in male 07/02/2014   Inguinal hernia, left 07/02/2014   Kidney stone    Prostate enlargement     Patient Active Problem List   Diagnosis Date Noted   HTN (hypertension) 09/10/2015   Plantar fasciitis of left  foot 09/10/2015   Chest wall pain 04/03/2015   Gastritis and gastroduodenitis 04/03/2015   Insomnia 04/03/2015   Snoring 04/03/2015   Chronic insomnia 10/31/2014   Elevated BP 10/31/2014   Gastroesophageal reflux disease without esophagitis 10/31/2014   Erectile dysfunction 07/02/2014   Hypogonadism in male 07/02/2014   Difficult or painful urination 05/19/2014   Enlarged prostate 05/19/2014   ED (erectile dysfunction) of organic origin 05/19/2014   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   Hernia, inguinal 05/19/2014   Hematuria, microscopic 05/19/2014   Prostatitis 05/19/2014   Calculus of kidney 05/19/2014   Diarrhea 01/17/2014   Excessive urination at night 07/01/2012   Must strain to pass urine 07/01/2012   Urgency of micturation 07/01/2012   FOM (frequency of micturition) 07/01/2012    Past Surgical History:  Procedure Laterality Date   APPENDECTOMY  1980   CYSTOSCOPY  03/31/2014   EXTRACORPOREAL SHOCK WAVE LITHOTRIPSY Left 10/10/2015   Procedure: EXTRACORPOREAL SHOCK WAVE LITHOTRIPSY (ESWL);  Surgeon: Hildred Laser, MD;  Location: [facility] ORS;  Service: Urology;  Laterality: Left;   LITHOTRIPSY      Prior to Admission medications   Medication Sig Start Date End Date Taking? Authorizing Provider  alfuzosin (UROXATRAL) 10 MG 24 hr tablet Take 1 tablet (10 mg total) by mouth daily with breakfast. 12/19/18   Vanna Scotland, MD  doxazosin (CARDURA) 4 MG tablet Take 1 tablet (4 mg total) by mouth daily. 12/07/18   Vanna Scotland, MD  omeprazole (PRILOSEC) 40 MG capsule Take 40  mg by mouth daily.    [provider]  predniSONE (STERAPRED UNI-PAK 21 TAB) 10 MG (21) TBPK tablet  12/06/18   [provider]  temazepam (RESTORIL) 15 MG capsule Take 15 mg by mouth at bedtime. 07/21/15   [provider]    Allergies Nortriptyline and Flomax [tamsulosin hcl]  Family History  Problem Relation Age of Onset   Bladder Cancer Neg Hx    Kidney  cancer Neg Hx    Prostate cancer Neg Hx     Social History Social History   Tobacco Use   Smoking status: Never Smoker   Smokeless tobacco: Never Used  Substance Use Topics   Alcohol use: No    Alcohol/week: 0.0 standard drinks   Drug use: No    Review of Systems  Constitutional: Negative for fever. + near syncope Eyes: Negative for visual changes. ENT: Negative for sore throat. Neck: No neck pain  Cardiovascular: Negative for chest pain. Respiratory: Negative for shortness of breath. Gastrointestinal: Negative for abdominal pain, vomiting or diarrhea. Genitourinary: + dysuria. Musculoskeletal: Negative for back pain. Skin: Negative for rash. Neurological: Negative for weakness or numbness. + HA Psych: No SI or HI  ____________________________________________   PHYSICAL EXAM:  VITAL SIGNS: ED Triage Vitals  Enc Vitals Group     BP 12/24/18 2305 139/85     Pulse Rate 12/24/18 2305 96     Resp 12/24/18 2305 18     Temp 12/24/18 2305 98.4 F (36.9 C)     Temp Source 12/24/18 2305 Oral     SpO2 12/24/18 2305 94 %     Weight 12/24/18 2320 168 lb (76.2 kg)     Height 12/24/18 2320 5\' 6"  (1.676 m)     Head Circumference --      Peak Flow --      Pain Score 12/24/18 2320 0     Pain Loc --      Pain Edu? --      Excl. in GC? --     Constitutional: Alert and oriented. Well appearing and in no apparent distress. HEENT:      Head: Normocephalic and atraumatic.         Eyes: Conjunctivae are normal. Sclera is non-icteric.       Mouth/Throat: Mucous membranes are moist.       Neck: Supple with no signs of meningismus. Cardiovascular: Regular rate and rhythm. No murmurs, gallops, or rubs. 2+ symmetrical distal pulses are present in all extremities. No JVD. Respiratory: Normal respiratory effort. Lungs are clear to auscultation bilaterally. No wheezes, crackles, or rhonchi.  Gastrointestinal: Soft, non tender, and non distended with positive bowel sounds. No  rebound or guarding. Musculoskeletal: Nontender with normal range of motion in all extremities. No edema, cyanosis, or erythema of extremities. Neurologic: Normal speech and language. Face is symmetric.  Intact strength and sensation x4, EOMI, PERRL. Skin: Skin is warm, dry and intact. No rash noted. Psychiatric: Mood and affect are normal. Speech and behavior are normal.  ____________________________________________   LABS (all labs ordered are listed, but only abnormal results are displayed)  Labs Reviewed  BASIC METABOLIC PANEL - Abnormal; Notable for the following components:      Result Value   Potassium 3.4 (*)    Glucose, Bld 162 (*)    Calcium 8.8 (*)    All other components within normal limits  URINALYSIS, COMPLETE (UACMP) WITH MICROSCOPIC - Abnormal; Notable for the following components:   Color, Urine YELLOW (*)  APPearance CLEAR (*)    All other components within normal limits  CBC  CBG MONITORING, ED  TROPONIN I (HIGH SENSITIVITY)   ____________________________________________  EKG  ED ECG REPORT I, Nita Sicklearolina Adryen Cookson, the attending physician, personally viewed and interpreted this ECG.  Normal sinus rhythm, 1st degree AV block, normal QTC and QRS, normal axis, no STE or depressions, no evidence of HOCM, AV block, delta wave, ARVD, prolonged QTc, WPW, or Brugada.  Unchanged from prior.   ____________________________________________  RADIOLOGY  I have personally reviewed the images performed during this visit and I agree with the Radiologist's read.   Interpretation by Radiologist:  Ct Head Wo Contrast  Result Date: 12/25/2018 CLINICAL DATA:  Sudden onset weakness and near syncope. Denies falling or head trauma. Headache. EXAM: CT HEAD WITHOUT CONTRAST TECHNIQUE: Contiguous axial images were obtained from the base of the skull through the vertex without intravenous contrast. COMPARISON:  07/30/2013 FINDINGS: Brain: Ventricles, cisterns and other CSF spaces  are normal. There is no mass, mass effect, shift of midline structures or acute hemorrhage. 2-3 mm calcification over the head of the left caudate nucleus unchanged. No evidence of acute infarction. Vascular: No hyperdense vessel or unexpected calcification. Skull: Normal. Negative for fracture or focal lesion. Sinuses/Orbits: No acute finding. Other: None. IMPRESSION: No acute findings. Electronically Signed   By: Elberta Fortisaniel  Boyle M.D.   On: 12/25/2018 04:33      ____________________________________________   PROCEDURES  Procedure(s) performed: None Procedures Critical Care performed:  None ____________________________________________   INITIAL IMPRESSION / ASSESSMENT AND PLAN / ED COURSE   58 y.o. male history of PE on Xarelto, hypertension, BPH, headaches who presents for evaluation of a near syncopal episode while urinating this evening.  Patient feels back to normal other than a headache which she has a history of similar headaches several times a week.  He is neurologically intact.  He has no fever or meningeal signs.  However due to to patient being on Xarelto we will send him for head CT to rule out intracranial bleed.  Low suspicion for subarachnoid with no thunderclap headache, no photophobia, no nuchal rigidity.  EKG with no evidence of dysrhythmias or ischemia.  Labs showing no significant electrolyte abnormalities, dehydration, AKI, anemia.  UA is pending, troponins pending.  Will give Reglan, Tylenol and fluids for headache.  Orthostatic vital signs do show change in pulse from 57-77 from lying to standing however blood pressure went up therefore inconclusive.  Ddx vasovagal versus subarachnoid versus intracranial hemorrhage versus cardiac dysrhythmias versus anemia versus dehydration versus orthostasis versus UTI    _________________________ 5:36 AM on 12/25/2018 -----------------------------------------  CT head negative for acute findings done < 6hrs after symptoms started.  Troponin pending. UA negative for UTI. Patient monitored on telemetry with no signs of dysrhythmia.  Remains well-appearing and asymptomatic.  Headache has resolved.  Patient will be discharged home with follow-up with PCP.  Discussed my standard return precautions.  At this time most likely cause vasovagal.    As part of my medical decision making, I reviewed the following data within the electronic MEDICAL RECORD NUMBER History obtained from family, Nursing notes reviewed and incorporated, Labs reviewed , EKG interpreted , Old chart reviewed, Radiograph reviewed , Notes from prior ED visits and [place] Controlled Substance Database   Patient was evaluated in Emergency Department today for the symptoms described in the history of present illness. Patient was evaluated in the context of the global COVID-19 pandemic, which necessitated consideration that the patient might  be at risk for infection with the SARS-CoV-2 virus that causes COVID-19. Institutional protocols and algorithms that pertain to the evaluation of patients at risk for COVID-19 are in a state of rapid change based on information released by regulatory bodies including the CDC and federal and state organizations. These policies and algorithms were followed during the patient's care in the ED.   ____________________________________________   FINAL CLINICAL IMPRESSION(S) / ED DIAGNOSES   Final diagnoses:  None      NEW MEDICATIONS STARTED DURING THIS VISIT:  ED Discharge Orders    None       Note:  This document was prepared using Dragon voice recognition software and may include unintentional dictation errors.    Alfred Levins, [place], MD 12/25/18 [id number]

## 2018-12-25 NOTE — ED Notes (Signed)
Pt's wife says last night around 10pm pt got up to use the bathroom and almost passed out; felt very weak; vision was blurry; pt arrives awake and alert; talking in complete coherent sentences; denies cough/cold chest pain; on reports squeezing pain to entire head since incident happened;

## 2018-12-25 NOTE — ED Notes (Signed)
Lab called to run troponin on blood already sent to lab

## 2018-12-25 NOTE — ED Notes (Signed)
Dr Alfred Levins in to follow up with pt

## 2019-01-10 ENCOUNTER — Inpatient Hospital Stay: Payer: 59 | Attending: Oncology | Admitting: Oncology

## 2019-01-23 ENCOUNTER — Encounter: Payer: Self-pay | Admitting: Oncology

## 2019-01-23 ENCOUNTER — Other Ambulatory Visit: Payer: Self-pay

## 2019-01-23 ENCOUNTER — Inpatient Hospital Stay: Payer: 59

## 2019-01-23 ENCOUNTER — Inpatient Hospital Stay: Payer: 59 | Attending: Oncology | Admitting: Oncology

## 2019-01-23 ENCOUNTER — Encounter (INDEPENDENT_AMBULATORY_CARE_PROVIDER_SITE_OTHER): Payer: Self-pay

## 2019-01-23 VITALS — BP 163/109 | HR 80 | Temp 98.7°F | Wt 170.0 lb

## 2019-01-23 DIAGNOSIS — Z86711 Personal history of pulmonary embolism: Secondary | ICD-10-CM | POA: Diagnosis present

## 2019-01-23 DIAGNOSIS — Z7901 Long term (current) use of anticoagulants: Secondary | ICD-10-CM | POA: Diagnosis not present

## 2019-01-23 DIAGNOSIS — Z87442 Personal history of urinary calculi: Secondary | ICD-10-CM | POA: Insufficient documentation

## 2019-01-23 DIAGNOSIS — Z79899 Other long term (current) drug therapy: Secondary | ICD-10-CM | POA: Insufficient documentation

## 2019-01-23 DIAGNOSIS — I2694 Multiple subsegmental pulmonary emboli without acute cor pulmonale: Secondary | ICD-10-CM

## 2019-01-23 DIAGNOSIS — I10 Essential (primary) hypertension: Secondary | ICD-10-CM | POA: Diagnosis not present

## 2019-01-23 DIAGNOSIS — M76899 Other specified enthesopathies of unspecified lower limb, excluding foot: Secondary | ICD-10-CM | POA: Insufficient documentation

## 2019-01-23 DIAGNOSIS — K219 Gastro-esophageal reflux disease without esophagitis: Secondary | ICD-10-CM

## 2019-01-23 DIAGNOSIS — N401 Enlarged prostate with lower urinary tract symptoms: Secondary | ICD-10-CM | POA: Insufficient documentation

## 2019-01-23 MED ORDER — RIVAROXABAN 20 MG PO TABS: 20.0000 mg | ORAL_TABLET | Freq: Every day | ORAL | 6 refills | Status: DC

## 2019-01-23 NOTE — Progress Notes (Signed)
Patient is here today to establish care for his pulmonary embolism on his left arm. Patient stated that he was seen in [facility] on September 2020 and when they drew his blood at his PCP, he started to have pain on his left arm. Then his PCP sent him to an orthopedic and he recommended physical therapy for 6 months.

## 2019-01-24 LAB — LUPUS ANTICOAGULANT
DRVVT: 26.9 s (ref 0.0–47.0)
PTT Lupus Anticoagulant: 29.2 s (ref 0.0–51.9)
Thrombin Time: 18.6 s (ref 0.0–23.0)
dPT Confirm Ratio: 0.92 Ratio (ref 0.00–1.40)
dPT: 31 s (ref 0.0–55.0)

## 2019-01-24 LAB — BETA-2-GLYCOPROTEIN I ABS, IGG/M/A
Beta-2 Glyco I IgG: <9 GPI IgG units (ref 0–20)
Beta-2-Glycoprotein I IgA: 18 GPI IgA units (ref 0–25)
Beta-2-Glycoprotein I IgM: <9 GPI IgM units (ref 0–32)

## 2019-01-24 LAB — PROTEIN S PANEL
Protein S Activity: 68 % (ref 63–140)
Protein S Ag, Free: 72 % (ref 57–157)
Protein S Ag, Total: 78 % (ref 60–150)

## 2019-01-24 LAB — HEXAGONAL PHASE PHOSPHOLIPID: Hex Phosph Neut Test: 0 s (ref 0–11)

## 2019-01-24 LAB — CARDIOLIPIN ANTIBODIES, IGG, IGM, IGA
Anticardiolipin IgA: <9 APL U/mL (ref 0–11)
Anticardiolipin IgG: <9 GPL U/mL (ref 0–14)
Anticardiolipin IgM: 16 MPL U/mL — ABNORMAL HIGH (ref 0–12)

## 2019-01-24 LAB — HEX PHASE PHOSPHOLIPID REFLEX

## 2019-01-24 LAB — PROTEIN C, TOTAL: Protein C, Total: 129 % (ref 60–150)

## 2019-01-25 ENCOUNTER — Encounter: Payer: Self-pay | Admitting: Oncology

## 2019-01-25 LAB — ANTITHROMBIN III: AntiThromb III Func: 105 % (ref 75–120)

## 2019-01-25 NOTE — Progress Notes (Signed)
Hematology/Oncology Consult note [facility] [phone] [phone]  Patient Care Team: [place] as PCP - General Jamal Collin, Andreas Newport, MD (General Surgery) Collier Flowers, MD as Referring Physician (Urology)   Name of the patient: Allen Chapman  [id number]  [birth year]    Reason for referral- pulmonary embolism in July 2020   [facility] physician- [facility]  Date of visit: 01/25/19   History of presenting illness- Patient is a 58 year old Hispanic male referred to [place] for history of pulmonary embolism.  History obtained with the help of Spanish interpreter.  His past medical history is significant for hypertension, GERD among other medical problems.  He presented to the ER in July 2020 with symptoms of chest pain for 3 to 4 days.  CT PE showed small acute emboli within the distal segmental/subsegmental right middle lobe branch vessels as well as the lower lobe pulmonary vessels.  Patient was discharged on Xarelto which she took for about 5 months and stopped taking it 1 week ago.  Patient also underwent CT abdomen to rule out malignancy and did not have any evidence of malignancy but was found to have prostatomegaly for which he is following up with urology.  Patient currently reports that he has a good appetite and denies any unintentional weight loss.  He has never had a colonoscopy.  Denies any bleeding in his stool or urine.  He has not had any problems tolerating Xarelto.  No prior history of DVT or PE.  No family history of blood clots.  No history of immobilization trauma or surgery prior to his episode of PE.  Patient reports that around a month ago he had a brief episode of dizzinessAnd near syncope and went to the ER.  CT head at that time did not reveal any significant findings.  Troponin levels were negative.  ECOG PS- 0  Pain scale- 0   Review of systems- Review of Systems  Constitutional: Negative for chills,  fever, malaise/fatigue and weight loss.  HENT: Negative for congestion, ear discharge and nosebleeds.   Eyes: Negative for blurred vision.  Respiratory: Negative for cough, hemoptysis, sputum production, shortness of breath and wheezing.   Cardiovascular: Negative for chest pain, palpitations, orthopnea and claudication.  Gastrointestinal: Negative for abdominal pain, blood in stool, constipation, diarrhea, heartburn, melena, nausea and vomiting.  Genitourinary: Negative for dysuria, flank pain, frequency, hematuria and urgency.  Musculoskeletal: Negative for back pain, joint pain and myalgias.  Skin: Negative for rash.  Neurological: Negative for dizziness, tingling, focal weakness, seizures, weakness and headaches.  Endo/Heme/Allergies: Does not bruise/bleed easily.  Psychiatric/Behavioral: Negative for depression and suicidal ideas. The patient does not have insomnia.     Allergies  Allergen Reactions  . Nortriptyline Shortness Of Breath  . Flomax [Tamsulosin Hcl] Other (See Comments)    Dizziness    Patient Active Problem List   Diagnosis Date Noted  . Adductor tendinitis 01/23/2019  . HTN (hypertension) 09/10/2015  . Plantar fasciitis of left foot 09/10/2015  . Chest wall pain 04/03/2015  . Gastritis and gastroduodenitis 04/03/2015  . Insomnia 04/03/2015  . Snoring 04/03/2015  . Chronic insomnia 10/31/2014  . Elevated BP 10/31/2014  . Gastroesophageal reflux disease without esophagitis 10/31/2014  . Erectile dysfunction 07/02/2014  . Hypogonadism in male 07/02/2014  . Difficult or painful urination 05/19/2014  . Enlarged prostate 05/19/2014  . ED (erectile dysfunction) of organic origin 05/19/2014  . 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 Hernia, inguinal 05/19/2014  . Hematuria, microscopic 05/19/2014  .  Prostatitis 05/19/2014  . Calculus of kidney 05/19/2014  . Diarrhea 01/17/2014  . Excessive urination at night 07/01/2012  . Must strain to pass urine 07/01/2012  . Urgency of  micturation 07/01/2012  . FOM (frequency of micturition) 07/01/2012     Past Medical History:  Diagnosis Date  . Erectile dysfunction 07/02/2014  . GERD (gastroesophageal reflux disease)   . Hematuria, microscopic 07/02/2014  . Hypogonadism in male 07/02/2014  . Inguinal hernia, left 07/02/2014  . Kidney stone   . Prostate enlargement      Past Surgical History:  Procedure Laterality Date  . APPENDECTOMY  1980  . CYSTOSCOPY  03/31/2014  . EXTRACORPOREAL SHOCK WAVE LITHOTRIPSY Left 10/10/2015   Procedure: EXTRACORPOREAL SHOCK WAVE LITHOTRIPSY (ESWL);  Surgeon: Hildred Laser, MD;  Location: [facility] ORS;  Service: Urology;  Laterality: Left;  . LITHOTRIPSY      Social History   Socioeconomic History  . Marital status: Married    Spouse name: Not on file  . Number of children: Not on file  . Years of education: Not on file  . Highest education level: Not on file  Occupational History  . Not on file  Social Needs  . Financial resource strain: Not on file  . Food insecurity    Worry: Not on file    Inability: Not on file  . Transportation needs    Medical: Not on file    Non-medical: Not on file  Tobacco Use  . Smoking status: Never Smoker  . Smokeless tobacco: Never Used  Substance and Sexual Activity  . Alcohol use: No    Alcohol/week: 0.0 standard drinks  . Drug use: No  . Sexual activity: Not on file  Lifestyle  . Physical activity    Days per week: Not on file    Minutes per session: Not on file  . Stress: Not on file  Relationships  . Social Musician on phone: Not on file    Gets together: Not on file    Attends religious service: Not on file    Active member of club or organization: Not on file    Attends meetings of clubs or organizations: Not on file    Relationship status: Not on file  . Intimate partner violence    Fear of current or ex partner: Not on file    Emotionally abused: Not on file    Physically abused: Not on file     Forced sexual activity: Not on file  Other Topics Concern  . Not on file  Social History Narrative  . Not on file     Family History  Problem Relation Age of Onset  . Bladder Cancer Neg Hx   . Kidney cancer Neg Hx   . Prostate cancer Neg Hx      Current Outpatient Medications:  .  omeprazole (PRILOSEC) 40 MG capsule, Take 40 mg by mouth daily., Disp: , Rfl:  .  rivaroxaban (XARELTO) 20 MG TABS tablet, Take 1 tablet (20 mg total) by mouth daily with supper., Disp: 30 tablet, Rfl: 6 .  temazepam (RESTORIL) 15 MG capsule, Take 15 mg by mouth at bedtime., Disp: , Rfl:    Physical exam:  Vitals:   01/23/19 1506  BP: (!) 163/109  Pulse: 80  Temp: 98.7 F (37.1 C)  TempSrc: Tympanic  Weight: 170 lb (77.1 kg)   Physical Exam Constitutional:      General: He is not in acute distress. HENT:  Head: Normocephalic and atraumatic.  Eyes:     Pupils: Pupils are equal, round, and reactive to light.  Neck:     Musculoskeletal: Normal range of motion.  Cardiovascular:     Rate and Rhythm: Normal rate and regular rhythm.     Heart sounds: Normal heart sounds.  Pulmonary:     Effort: Pulmonary effort is normal.     Breath sounds: Normal breath sounds.  Abdominal:     General: Bowel sounds are normal.     Palpations: Abdomen is soft.  Skin:    General: Skin is warm and dry.  Neurological:     Mental Status: He is alert and oriented to person, place, and time.        CMP Latest Ref Rng & Units 12/24/2018  Glucose 70 - 99 mg/dL 161(W162(H)  BUN 6 - 20 mg/dL 20  Creatinine 9.600.61 - 4.541.24 mg/dL 0.980.69  Sodium 119135 - 147145 mmol/L 141  Potassium 3.5 - 5.1 mmol/L 3.4(L)  Chloride 98 - 111 mmol/L 110  CO2 22 - 32 mmol/L 23  Calcium 8.9 - 10.3 mg/dL 8.2(N8.8(L)  Total Protein 6.5 - 8.1 g/dL -  Total Bilirubin 0.3 - 1.2 mg/dL -  Alkaline Phos 38 - 562126 U/L -  AST 15 - 41 U/L -  ALT 0 - 44 U/L -   CBC Latest Ref Rng & Units 12/24/2018  WBC 4.0 - 10.5 K/uL 4.3  Hemoglobin 13.0 - 17.0 g/dL  13.014.5  Hematocrit 86.539.0 - 52.0 % 42.6  Platelets 150 - 400 K/uL 185    No images are attached to the encounter.  No results found.  Assessment and plan- Patient is a 58 y.o. male with history of unprovoked pulmonary embolism in July 2020  Discussed with the patient that he had an episode of unprovoked PE in July 2020 and given his male sex I would recommend indefinite anticoagulation unless there are any concerns for bleeding.  Patient stopped taking his Xarelto a week ago and I would like to restart at this time.  Discussed with the patient that we could do a hypercoagulable work-up however it would not change his management as he would need indefinite anticoagulation regardless.  I will check protein C, protein S, factor V Leiden, prothrombin gene mutation, Antithrombin III levels as well as antiphospholipid antibody testing today.  Malignancy work-up as such is not required so far which did not reveal any evidence of malignancy.  However he is not up-to-date with his colonoscopy for an unprovoked PE.  He is already had a CT chest abdomen and I will refer him to gastroenterology at my next visit after he has been on anticoagulation for up to 6 months.  I would like him to stay on anticoagulation for a minimum of 6 months which would be until end of January following which we could hold his anticoagulation for 2 days prior to any invasive procedures as planned by urology such as a cystoscopy.  He will need to restart anticoagulation right after the procedure if deemed okay by urology.  Patient verbalized understanding  I will follow up with the patient in 6 months time with a video visit   Thank you for this kind referral and the opportunity to participate in the care of this  Patient   Visit Diagnosis 1. Multiple subsegmental pulmonary emboli without acute cor pulmonale (HCC)     Dr. Owens SharkArchana August Longest, MD, MPH [facility] [phone] 01/25/2019

## 2019-01-27 LAB — PROTHROMBIN GENE MUTATION

## 2019-01-27 LAB — FACTOR 5 LEIDEN

## 2019-03-08 ENCOUNTER — Other Ambulatory Visit: Payer: 59 | Admitting: Urology

## 2019-03-22 ENCOUNTER — Other Ambulatory Visit: Payer: 59 | Admitting: Urology

## 2019-03-22 ENCOUNTER — Encounter: Payer: Self-pay | Admitting: Urology

## 2019-04-10 ENCOUNTER — Other Ambulatory Visit: Payer: Self-pay | Admitting: Orthopedic Surgery

## 2019-04-10 DIAGNOSIS — M5416 Radiculopathy, lumbar region: Secondary | ICD-10-CM

## 2019-05-06 ENCOUNTER — Ambulatory Visit: Payer: 59 | Attending: Internal Medicine

## 2019-05-06 DIAGNOSIS — Z23 Encounter for immunization: Secondary | ICD-10-CM

## 2019-05-06 NOTE — Progress Notes (Signed)
   Covid-19 Vaccination Clinic  Name:  Allen Chapman    MRN: [id number] DOB: [birth year]  05/06/2019  Mr. [name] was observed post Covid-19 immunization for 15 minutes without incident. He was provided with Vaccine Information Sheet and instruction to access the V-Safe system.   Mr. [name] was instructed to call 911 with any severe reactions post vaccine: Marland Kitchen Difficulty breathing  . Swelling of face and throat  . A fast heartbeat  . A bad rash all over body  . Dizziness and weakness   Immunizations Administered    Name Date Dose VIS Date Route   Pfizer COVID-19 Vaccine 05/06/2019 11:26 AM 0.3 mL 01/27/2019 Intramuscular   Manufacturer: [employer], [employer]   Lot: [id number]   NDC: [address]-9

## 2019-05-24 ENCOUNTER — Ambulatory Visit: Payer: 59 | Admitting: Podiatry

## 2019-05-27 ENCOUNTER — Ambulatory Visit: Payer: 59 | Attending: Internal Medicine

## 2019-05-27 DIAGNOSIS — Z23 Encounter for immunization: Secondary | ICD-10-CM

## 2019-05-27 NOTE — Progress Notes (Signed)
   Covid-19 Vaccination Clinic  Name:  Allen Chapman    MRN: [id number] DOB: 1960/06/18  05/27/2019  [name] was observed post Covid-19 immunization for 15 minutes   without incident. He was provided with Vaccine Information Sheet and instruction to access the V-Safe system.   [name] was instructed to call 911 with any severe reactions post vaccine: Marland Kitchen Difficulty breathing  . Swelling of face and throat  . A fast heartbeat  . A bad rash all over body  . Dizziness and weakness   Immunizations Administered    Name Date Dose VIS Date Route   Pfizer COVID-19 Vaccine 05/27/2019 11:33 AM 0.3 mL 01/27/2019 Intramuscular   Manufacturer: [employer], [employer]   Lot: [phone] [id number]   NDC: [address]-6

## 2019-06-14 ENCOUNTER — Ambulatory Visit: Payer: 59 | Admitting: Podiatry

## 2019-07-21 ENCOUNTER — Inpatient Hospital Stay: Payer: 59 | Attending: Oncology

## 2019-07-24 ENCOUNTER — Telehealth: Payer: Self-pay | Admitting: Oncology

## 2019-07-24 ENCOUNTER — Other Ambulatory Visit: Payer: Self-pay

## 2019-07-24 ENCOUNTER — Inpatient Hospital Stay: Payer: 59 | Admitting: Oncology

## 2019-07-24 NOTE — Telephone Encounter (Signed)
Writer was informed that patient wanted to reschedule appt. Appt rescheduled. Interpreter assisting with informing patient.

## 2019-08-01 ENCOUNTER — Telehealth: Payer: 59 | Admitting: Oncology

## 2019-08-07 ENCOUNTER — Ambulatory Visit: Payer: 59 | Admitting: Podiatry

## 2019-08-07 ENCOUNTER — Encounter: Payer: Self-pay | Admitting: Podiatry

## 2019-08-07 ENCOUNTER — Ambulatory Visit (INDEPENDENT_AMBULATORY_CARE_PROVIDER_SITE_OTHER): Payer: 59

## 2019-08-07 ENCOUNTER — Other Ambulatory Visit: Payer: Self-pay | Admitting: Podiatry

## 2019-08-07 ENCOUNTER — Other Ambulatory Visit: Payer: Self-pay

## 2019-08-07 DIAGNOSIS — M7671 Peroneal tendinitis, right leg: Secondary | ICD-10-CM

## 2019-08-07 DIAGNOSIS — G5762 Lesion of plantar nerve, left lower limb: Secondary | ICD-10-CM

## 2019-08-07 DIAGNOSIS — G5782 Other specified mononeuropathies of left lower limb: Secondary | ICD-10-CM

## 2019-08-07 DIAGNOSIS — M7672 Peroneal tendinitis, left leg: Secondary | ICD-10-CM

## 2019-08-07 DIAGNOSIS — M722 Plantar fascial fibromatosis: Secondary | ICD-10-CM

## 2019-08-07 MED ORDER — MELOXICAM 15 MG PO TABS: 15.0000 mg | ORAL_TABLET | Freq: Every day | ORAL | 3 refills | Status: DC

## 2019-08-07 MED ORDER — METHYLPREDNISOLONE 4 MG PO TBPK: ORAL_TABLET | ORAL | 0 refills | Status: DC

## 2019-08-07 NOTE — Progress Notes (Signed)
He presents today with an interpreter complaining of painful dorsal and dorsal lateral aspect of the bilateral foot states that the left foot is painful and radiates proximally all of this pains been going on now for the past year or so.  States that his plantar fasciitis is doing much better.  He states that the pain is sharp and it burns a lot.  He has tried ice which helps diclofenac gel and massage.  Objective: Vital signs are stable he is alert oriented x3 pulses are palpable.  Neurologic sensorium is intact he does have a palpable Mulder's click third interdigital space of the left foot.  Muscle strength is normal symmetrical bilateral though he does have tenderness on palpation and abduction against resistance bilaterally particularly the peroneus brevis tendon.  Cutaneous evaluation does not demonstrate any open lesions or wounds.  Assessment: Peroneal tendinitis bilaterally.  Neuroma third interspace left.  Plan: Discussed etiology pathology conservative surgical therapy start him on a Medrol Dosepak to be followed by meloxicam.  Injected the third interdigital space of the left foot with 10 mg of Kenalog 5 mg of Marcaine.  Injected the bilateral feet overlying the peroneus brevis tendon at the point of maximal tenderness tensed milligrams of Kenalog and 5 mg of Marcaine bilaterally.  Tolerated this procedure well without complications we discussed appropriate shoe gear ice therapy shoe gear modifications.

## 2019-08-08 ENCOUNTER — Inpatient Hospital Stay: Payer: 59 | Admitting: Oncology

## 2019-08-18 ENCOUNTER — Telehealth: Payer: Self-pay | Admitting: Oncology

## 2019-08-18 ENCOUNTER — Inpatient Hospital Stay: Payer: 59 | Admitting: Oncology

## 2019-08-18 ENCOUNTER — Encounter: Payer: Self-pay | Admitting: Oncology

## 2019-08-18 NOTE — Telephone Encounter (Signed)
Patient left voicemail requesting to reschedule appt. Writer has attempted several times to reach patient to reschedule but could not reach patient or leave a message. Letter sent requesting [facility] to [facility].

## 2019-09-13 ENCOUNTER — Ambulatory Visit: Payer: 59 | Admitting: Podiatry

## 2019-09-23 ENCOUNTER — Other Ambulatory Visit: Payer: Self-pay | Admitting: Family Medicine

## 2019-09-23 ENCOUNTER — Other Ambulatory Visit (HOSPITAL_COMMUNITY): Payer: Self-pay | Admitting: Family Medicine

## 2019-09-23 DIAGNOSIS — R42 Dizziness and giddiness: Secondary | ICD-10-CM

## 2019-09-23 DIAGNOSIS — R519 Headache, unspecified: Secondary | ICD-10-CM

## 2019-09-23 DIAGNOSIS — S0990XA Unspecified injury of head, initial encounter: Secondary | ICD-10-CM

## 2019-09-23 DIAGNOSIS — R259 Unspecified abnormal involuntary movements: Secondary | ICD-10-CM

## 2019-10-20 ENCOUNTER — Ambulatory Visit: Payer: 59

## 2019-11-02 ENCOUNTER — Encounter: Payer: Self-pay | Admitting: Physician Assistant

## 2019-11-02 ENCOUNTER — Ambulatory Visit (INDEPENDENT_AMBULATORY_CARE_PROVIDER_SITE_OTHER): Payer: 59 | Admitting: Physician Assistant

## 2019-11-02 ENCOUNTER — Other Ambulatory Visit: Payer: Self-pay

## 2019-11-02 VITALS — BP 155/93 | HR 91 | Ht 66.0 in | Wt 172.0 lb

## 2019-11-02 DIAGNOSIS — N411 Chronic prostatitis: Secondary | ICD-10-CM | POA: Diagnosis not present

## 2019-11-02 DIAGNOSIS — R350 Frequency of micturition: Secondary | ICD-10-CM

## 2019-11-02 LAB — BLADDER SCAN AMB NON-IMAGING

## 2019-11-02 MED ORDER — ALFUZOSIN HCL ER 10 MG PO TB24: 10.0000 mg | ORAL_TABLET | Freq: Every day | ORAL | 5 refills | Status: AC

## 2019-11-02 MED ORDER — MELOXICAM 15 MG PO TABS: 15.0000 mg | ORAL_TABLET | Freq: Every day | ORAL | 0 refills | Status: DC

## 2019-11-02 NOTE — Progress Notes (Signed)
11/02/2019 2:53 PM   Allen Chapman January 14, 1961 [id number]  CC: Chief Complaint  Patient presents with  . Dysuria  . Urinary Frequency    HPI: Allen Chapman is a 59 y.o. male with PMH BPH who failed Flomax, Cardura, and finasteride due to side effects who presents today for evaluation of possible UTI. He is an established [facility] patient who last saw Dr. Apolinar Junes on 12/07/2018 for evaluation of the above.  Surgical evaluation was deferred at that time due to recent bilateral pulmonary emboli and anticoagulation with plans for cystoscopy in January 2021.  He no-showed that appointment.  He was seen by Dr. Smith Robert in the heme-onc clinic on 01/23/2019 to discuss management of his unprovoked PE.  At that time, he was recommended to proceed with indefinite anticoagulation on Xarelto.  Patient no showed his scheduled follow-up with Dr. Smith Robert in July 2021 and reports not taking Xarelto at today's visit.  Today he reports a 2-week history of frequency, urgency, dysuria, urinary leakage, weak stream, low back pain, lower abdominal pain, chills, and pain with sex.  He denies fever, nausea, vomiting, and gross hematuria.  He states he completed a 1 month course of alfuzosin, which helped his symptoms, however he ran out of this medication and did not seek a refill.  He saw his PCP 2 days ago for evaluation of the same.  UA notable for orange color, glucosuria, proteinuria, trace ketones, and nitrites.  Urine culture has since resulted with no growth.  He was prescribed Cipro 500 mg twice daily x7 days for empiric treatment of UTI.  He underwent CTAP with contrast on 10/07/2018 with no noted urolithiasis.  In-office UA today positive for trace-intact blood; urine microscopy pan negative. PVR 83mL.  PMH: Past Medical History:  Diagnosis Date  . Erectile dysfunction 07/02/2014  . GERD (gastroesophageal reflux disease)   . Hematuria, microscopic 07/02/2014  . Hypogonadism in male 07/02/2014  . Inguinal  hernia, left 07/02/2014  . Kidney stone   . Prostate enlargement     Surgical History: Past Surgical History:  Procedure Laterality Date  . APPENDECTOMY  1980  . CYSTOSCOPY  03/31/2014  . EXTRACORPOREAL SHOCK WAVE LITHOTRIPSY Left 10/10/2015   Procedure: EXTRACORPOREAL SHOCK WAVE LITHOTRIPSY (ESWL);  Surgeon: Hildred Laser, MD;  Location: [facility] ORS;  Service: Urology;  Laterality: Left;  . LITHOTRIPSY      Home Medications:  Allergies as of 11/02/2019      Reactions   Nortriptyline Shortness Of Breath   Flomax [tamsulosin Hcl] Other (See Comments)   Dizziness      Medication List       Accurate as of November 02, 2019  2:53 PM. If you have any questions, ask your nurse or doctor.        alfuzosin 10 MG 24 hr tablet Commonly known as: UROXATRAL Take 1 tablet (10 mg total) by mouth daily with breakfast. Started by: Carman Ching, PA-C   ciprofloxacin 500 MG tablet Commonly known as: CIPRO Take 500 mg by mouth 2 (two) times daily.   meloxicam 15 MG tablet Commonly known as: MOBIC Take 1 tablet (15 mg total) by mouth daily.   methylPREDNISolone 4 MG Tbpk tablet Commonly known as: MEDROL DOSEPAK 6 day dose pack - take as directed   naproxen 500 MG tablet Commonly known as: NAPROSYN Take 500 mg by mouth 2 (two) times daily.   omeprazole 40 MG capsule Commonly known as: PRILOSEC Take 40 mg by mouth daily.   predniSONE 10 MG  tablet Commonly known as: DELTASONE Take 10 mg by mouth 2 (two) times daily.   sucralfate 1 g tablet Commonly known as: CARAFATE Take by mouth daily.   temazepam 15 MG capsule Commonly known as: RESTORIL Take 15 mg by mouth at bedtime.   traMADol 50 MG tablet Commonly known as: ULTRAM Take 50 mg by mouth every 8 (eight) hours as needed.   Xarelto 20 MG Tabs tablet Generic drug: rivaroxaban       Allergies:  Allergies  Allergen Reactions  . Nortriptyline Shortness Of Breath  . Flomax [Tamsulosin Hcl] Other (See  Comments)    Dizziness    Family History: Family History  Problem Relation Age of Onset  . Bladder Cancer Neg Hx   . Kidney cancer Neg Hx   . Prostate cancer Neg Hx     Social History:   reports that he has never smoked. He has never used smokeless tobacco. He reports that he does not drink alcohol and does not use drugs.  Physical Exam: BP (!) 155/93 (BP Location: Left Arm, Patient Position: Sitting, Cuff Size: Normal)   Pulse 91   Ht 5\' 6"  (1.676 m)   Wt 172 lb (78 kg)   BMI 27.76 kg/m   Constitutional:  Alert and oriented, no acute distress, nontoxic appearing HEENT: [place], AT Cardiovascular: No clubbing, cyanosis, or edema Respiratory: Normal respiratory effort, no increased work of breathing Skin: No rashes, bruises or suspicious lesions Neurologic: Grossly intact, no focal deficits, moving all 4 extremities Psychiatric: Normal mood and affect  Laboratory Data: Results for orders placed or performed in visit on 11/02/19  Microscopic Examination   Urine  Result Value Ref Range   WBC, UA 0-5 0 - 5 /hpf   RBC 0-2 0 - 2 /hpf   Epithelial Cells (non renal) 0-10 0 - 10 /hpf   Casts Present (A) None seen /lpf   Cast Type Hyaline casts N/A   Bacteria, UA Present (A) None seen/Few  Urinalysis, Complete  Result Value Ref Range   Specific Gravity, UA >1.030 (H) 1.005 - 1.030   pH, UA 5.0 5.0 - 7.5   Color, UA Yellow Yellow   Appearance Ur Clear Clear   Leukocytes,UA Negative Negative   Protein,UA Negative Negative/Trace   Glucose, UA Negative Negative   Ketones, UA Negative Negative   RBC, UA Trace (A) Negative   Bilirubin, UA Negative Negative   Urobilinogen, Ur 0.2 0.2 - 1.0 mg/dL   Nitrite, UA Negative Negative   Microscopic Examination See below:   Bladder Scan (Post Void Residual) in office  Result Value Ref Range   Scan Result 62mL    Assessment & Plan:   1. Chronic prostatitis UA benign today, PVR WNL.  I suspect chronic prostatitis, consistent with prior  diagnosis.  As patient has discontinued anticoagulation and appeared to tolerate alfuzosin well in the past, I am prescribing him a 1 month course of meloxicam and restarting him on alfuzosin today.  I explained that alfuzosin will be a once a day forever medication for him and that he will need to take this continuously for management of his urinary symptoms moving forward.  He expressed understanding.  I counseled the patient to call the office in 1 week if his symptoms have not begun to improve. If he remains symptomatic at that time, I will start him on a 1 month course of Bactrim for management of possible bacterial prostatitis.  He expressed understanding.  Will defer surgical evaluation in the  future until he is reevaluated by heme-onc and likely put back on anticoagulation. - Urinalysis, Complete - Bladder Scan (Post Void Residual) in office - meloxicam (MOBIC) 15 MG tablet; Take 1 tablet (15 mg total) by mouth daily.  Dispense: 30 tablet; Refill: 0 - alfuzosin (UROXATRAL) 10 MG 24 hr tablet; Take 1 tablet (10 mg total) by mouth daily with breakfast.  Dispense: 30 tablet; Refill: 5   Return if symptoms worsen or fail to improve.  Carman Ching, PA-C  [facility] [address] [place], [place] [address] [phone]

## 2019-11-03 LAB — MICROSCOPIC EXAMINATION

## 2019-11-03 LAB — URINALYSIS, COMPLETE
Bilirubin, UA: NEGATIVE
Glucose, UA: NEGATIVE
Ketones, UA: NEGATIVE
Leukocytes,UA: NEGATIVE
Nitrite, UA: NEGATIVE
Protein,UA: NEGATIVE
Specific Gravity, UA: >1.03 — ABNORMAL HIGH (ref 1.005–1.030)
Urobilinogen, Ur: 0.2 mg/dL (ref 0.2–1.0)
pH, UA: 5 (ref 5.0–7.5)

## 2019-11-05 ENCOUNTER — Emergency Department: Admission: EM | Admit: 2019-11-05 | Discharge: 2019-11-05 | Discharge status: Against Medical Advice | Payer: 59

## 2019-11-07 ENCOUNTER — Telehealth: Payer: Self-pay

## 2019-11-07 ENCOUNTER — Ambulatory Visit: Payer: Self-pay | Admitting: Physician Assistant

## 2019-11-07 MED ORDER — SULFAMETHOXAZOLE-TRIMETHOPRIM 800-160 MG PO TABS: 1.0000 | ORAL_TABLET | Freq: Two times a day (BID) | ORAL | 0 refills | Status: AC

## 2019-11-07 NOTE — Telephone Encounter (Signed)
Bactrim DS twice daily x28 days sent to the [facility] in [place].  Please counsel him to add this to his regimen of alfuzosin and meloxicam.

## 2019-11-07 NOTE — Telephone Encounter (Signed)
Patient notified

## 2019-11-07 NOTE — Telephone Encounter (Signed)
Patient was seen last week and is reporting that his symptoms are not getting better; burning, pain, and increased frequency. Patient is requesting that his medications be changed. Made patient appt for today however he says he needs a few days notice before he can come in, todays appt canceled. Please advise.

## 2019-11-08 ENCOUNTER — Emergency Department
Admission: EM | Admit: 2019-11-08 | Discharge: 2019-11-09 | Disposition: A | Discharge status: Home / Self Care | Payer: 59 | Attending: Emergency Medicine | Admitting: Emergency Medicine

## 2019-11-08 ENCOUNTER — Encounter: Payer: Self-pay | Admitting: Emergency Medicine

## 2019-11-08 ENCOUNTER — Other Ambulatory Visit: Payer: Self-pay

## 2019-11-08 DIAGNOSIS — R102 Pelvic and perineal pain: Secondary | ICD-10-CM | POA: Insufficient documentation

## 2019-11-08 DIAGNOSIS — I10 Essential (primary) hypertension: Secondary | ICD-10-CM | POA: Insufficient documentation

## 2019-11-08 DIAGNOSIS — R1030 Lower abdominal pain, unspecified: Secondary | ICD-10-CM

## 2019-11-08 DIAGNOSIS — N50812 Left testicular pain: Secondary | ICD-10-CM

## 2019-11-08 LAB — URINALYSIS, COMPLETE (UACMP) WITH MICROSCOPIC
Bacteria, UA: NONE SEEN
Bilirubin Urine: NEGATIVE
Glucose, UA: NEGATIVE mg/dL
Hgb urine dipstick: NEGATIVE
Ketones, ur: NEGATIVE mg/dL
Leukocytes,Ua: NEGATIVE
Nitrite: NEGATIVE
Protein, ur: NEGATIVE mg/dL
Specific Gravity, Urine: 1.025 (ref 1.005–1.030)
Squamous Epithelial / HPF: NONE SEEN (ref 0–5)
pH: 5 (ref 5.0–8.0)

## 2019-11-08 LAB — CBC
HCT: 43.6 % (ref 39.0–52.0)
Hemoglobin: 14.8 g/dL (ref 13.0–17.0)
MCH: 27.9 pg (ref 26.0–34.0)
MCHC: 33.9 g/dL (ref 30.0–36.0)
MCV: 82.1 fL (ref 80.0–100.0)
Platelets: 204 10*3/uL (ref 150–400)
RBC: 5.31 MIL/uL (ref 4.22–5.81)
RDW: 13.3 % (ref 11.5–15.5)
WBC: 5.3 10*3/uL (ref 4.0–10.5)
nRBC: 0 % (ref 0.0–0.2)

## 2019-11-08 LAB — BASIC METABOLIC PANEL
Anion gap: 9 (ref 5–15)
BUN: 17 mg/dL (ref 6–20)
CO2: 25 mmol/L (ref 22–32)
Calcium: 8.5 mg/dL — ABNORMAL LOW (ref 8.9–10.3)
Chloride: 104 mmol/L (ref 98–111)
Creatinine, Ser: 0.74 mg/dL (ref 0.61–1.24)
GFR calc Af Amer: >60 mL/min (ref >60)
GFR calc non Af Amer: >60 mL/min (ref >60)
Glucose, Bld: 98 mg/dL (ref 70–99)
Potassium: 4.1 mmol/L (ref 3.5–5.1)
Sodium: 138 mmol/L (ref 135–145)

## 2019-11-08 NOTE — ED Provider Notes (Signed)
[facility] Emergency Department Provider Note   ____________________________________________   First MD Initiated Contact with Patient 11/08/19 2327     (approximate)  I have reviewed the triage vital signs and the nursing notes.   HISTORY  Chief Complaint Groin Pain    HPI Allen Chapman is a 59 y.o. male patient with complaints of about 3 weeks of burning with urination.  He also complains of pain in his left groin going into the testicle that is worse with walking.  He says the pain starts in his low back and goes into the groin.  He saw the urologist and got medicine to help improve urine flow but that has not helped any.  He is also on Bactrim.  Patient says the pain is much worse at work where he has to walk.  Review of patient's old records show he has an enlarged prostate and urination at night and prostatitis.      Past Medical History:  Diagnosis Date  . Erectile dysfunction 07/02/2014  . GERD (gastroesophageal reflux disease)   . Hematuria, microscopic 07/02/2014  . Hypogonadism in male 07/02/2014  . Inguinal hernia, left 07/02/2014  . Kidney stone   . Prostate enlargement     Patient Active Problem List   Diagnosis Date Noted  . Adductor tendinitis 01/23/2019  . HTN (hypertension) 09/10/2015  . Plantar fasciitis of left foot 09/10/2015  . Chest wall pain 04/03/2015  . Gastritis and gastroduodenitis 04/03/2015  . Insomnia 04/03/2015  . Snoring 04/03/2015  . Chronic insomnia 10/31/2014  . Elevated BP 10/31/2014  . Gastroesophageal reflux disease without esophagitis 10/31/2014  . Erectile dysfunction 07/02/2014  . Hypogonadism in male 07/02/2014  . Difficult or painful urination 05/19/2014  . Enlarged prostate 05/19/2014  . ED (erectile dysfunction) of organic origin 05/19/2014  . 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 Hernia, inguinal 05/19/2014  . Hematuria, microscopic 05/19/2014  . Prostatitis 05/19/2014  . Calculus of kidney  05/19/2014  . Diarrhea 01/17/2014  . Excessive urination at night 07/01/2012  . Must strain to pass urine 07/01/2012  . Urgency of micturation 07/01/2012  . FOM (frequency of micturition) 07/01/2012    Past Surgical History:  Procedure Laterality Date  . APPENDECTOMY  1980  . CYSTOSCOPY  03/31/2014  . EXTRACORPOREAL SHOCK WAVE LITHOTRIPSY Left 10/10/2015   Procedure: EXTRACORPOREAL SHOCK WAVE LITHOTRIPSY (ESWL);  Surgeon: Hildred Laser, MD;  Location: [facility] ORS;  Service: Urology;  Laterality: Left;  . LITHOTRIPSY      Prior to Admission medications   Medication Sig Start Date End Date Taking? Authorizing Provider  alfuzosin (UROXATRAL) 10 MG 24 hr tablet Take 1 tablet (10 mg total) by mouth daily with breakfast. 11/02/19   Vaillancourt, Lelon Mast, PA-C  ciprofloxacin (CIPRO) 500 MG tablet Take 500 mg by mouth 2 (two) times daily. 10/31/19   [provider]  meloxicam (MOBIC) 15 MG tablet Take 1 tablet (15 mg total) by mouth daily. 11/02/19   Vaillancourt, Lelon Mast, PA-C  methylPREDNISolone (MEDROL DOSEPAK) 4 MG TBPK tablet 6 day dose pack - take as directed Patient not taking: Reported on 11/02/2019 08/07/19   Hyatt, Max T, DPM  naproxen (NAPROSYN) 500 MG tablet Take 500 mg by mouth 2 (two) times daily. Patient not taking: Reported on 11/02/2019 04/24/19   [provider]  omeprazole (PRILOSEC) 40 MG capsule Take 40 mg by mouth daily.    [provider]  oxyCODONE-acetaminophen (PERCOCET) 5-325 MG tablet Take 1 tablet by mouth every 4 (four) hours as  needed for severe pain. 11/09/19 11/08/20  Arnaldo NatalMalinda, Chandlar Staebell F, MD  predniSONE (DELTASONE) 10 MG tablet Take 10 mg by mouth 2 (two) times daily. Patient not taking: Reported on 11/02/2019 05/17/19   [provider]  rivaroxaban (XARELTO) 20 MG TABS tablet  03/04/19   [provider]  sucralfate (CARAFATE) 1 g tablet Take by mouth daily. Patient not taking: Reported on 11/02/2019 10/11/19   [provider]  sulfamethoxazole-trimethoprim (BACTRIM DS) 800-160 MG tablet Take 1 tablet by mouth 2 (two) times daily for 28 days. 11/07/19 12/05/19  Vaillancourt, Lelon MastSamantha, PA-C  temazepam (RESTORIL) 15 MG capsule Take 15 mg by mouth at bedtime. 07/21/15   [provider]  traMADol (ULTRAM) 50 MG tablet Take 50 mg by mouth every 8 (eight) hours as needed. Patient not taking: Reported on 11/02/2019 04/02/19   [provider]    Allergies Nortriptyline and Flomax [tamsulosin hcl]  Family History  Problem Relation Age of Onset  . Bladder Cancer Neg Hx   . Kidney cancer Neg Hx   . Prostate cancer Neg Hx     Social History Social History   Tobacco Use  . Smoking status: Never Smoker  . Smokeless tobacco: Never Used  Vaping Use  . Vaping Use: Never used  Substance Use Topics  . Alcohol use: No    Alcohol/week: 0.0 standard drinks  . Drug use: No    Review of Systems  Constitutional: No fever/chills Eyes: No visual changes. ENT: No sore throat. Cardiovascular: Denies chest pain. Respiratory: Denies shortness of breath. Gastrointestinal: No abdominal pain.  No nausea, no vomiting.  No diarrhea.  No constipation. Genitourinary:  dysuria. Musculoskeletal: Low back pain pain. Skin: Negative for rash. Neurological: Negative for headaches, focal weakness  ____________________________________________   PHYSICAL EXAM:  VITAL SIGNS: ED Triage Vitals  Enc Vitals Group     BP 11/08/19 1659 135/88     Pulse Rate 11/08/19 1659 (!) 106     Resp 11/08/19 1659 19     Temp 11/08/19 1659 98.7 F (37.1 C)     Temp Source 11/08/19 1659 Oral     SpO2 11/08/19 1659 96 %     Weight --      Height --      Head Circumference --      Peak Flow --      Pain Score 11/08/19 1714 9     Pain Loc --      Pain Edu? --      Excl. in GC? --     Constitutional: Alert and oriented. Well appearing and in no acute distress. Eyes: Conjunctivae are normal.  Head:  Atraumatic. Nose: No congestion/rhinnorhea. Mouth/Throat: Mucous membranes are moist.  Neck: No stridor.   Cardiovascular: Normal rate, regular rhythm. Grossly normal heart sounds.  Good peripheral circulation. Respiratory: Normal respiratory effort.  No retractions. Lungs CTAB. Gastrointestinal: Soft and nontender. No distention. No abdominal bruits. No CVA tenderness. Genitourinary: Normal noncircumcised male no apparent hernia with coughing.  There is tenderness on palpation of the left groin at the base of the scrotum.  I do not palpate any lymph nodes.  Patient seems to have a lump in the epididymis on the left side which is tender as well. Rectal exam: No masses.  Prostate is tender and soft. Musculoskeletal: No lower extremity tenderness nor edema.   Neurologic:  Normal speech and language. No gross focal neurologic deficits are appreciated. No gait instability no difficulty walking. Skin:  Skin is warm,  dry and intact. No rash noted.   ____________________________________________   LABS (all labs ordered are listed, but only abnormal results are displayed)  Labs Reviewed  URINALYSIS, COMPLETE (UACMP) WITH MICROSCOPIC - Abnormal; Notable for the following components:      Result Value   Color, Urine YELLOW (*)    APPearance HAZY (*)    All other components within normal limits  BASIC METABOLIC PANEL - Abnormal; Notable for the following components:   Calcium 8.5 (*)    All other components within normal limits  CBC   ____________________________________________  EKG   ____________________________________________  RADIOLOGY  ED MD interpretation:   CT read by radiology and reviewed by me does not show any reason for the patient's pain.  Ultrasound was read by radiology not reviewed by me.  Official radiology report(s): CT Renal Stone Study  Result Date: 11/09/2019 CLINICAL DATA:  Flank pain, dysuria, left groin pain EXAM: CT ABDOMEN AND PELVIS WITHOUT CONTRAST  TECHNIQUE: Multidetector CT imaging of the abdomen and pelvis was performed following the standard protocol without IV contrast. COMPARISON:  10/07/2018 FINDINGS: Lower chest: The visualized lung bases are clear. The visualized heart and pericardium are unremarkable. Hepatobiliary: No focal liver abnormality is seen. No gallstones, gallbladder wall thickening, or biliary dilatation. Pancreas: Are unremarkable Spleen: Unremarkable Adrenals/Urinary Tract: The adrenal glands are unremarkable. The kidneys are normal in size and position. Multiple bilateral simple cortical cysts are identified measuring up to 8.1 cm within the lower pole of the right kidney and 4.2 cm with within the lower pole of the left kidney. Hyperdense renal cyst arising from the lower pole of the right kidney is decreased in size since remote prior examination of 08/28/2013, safely considered benign. No intrarenal or ureteral calculi. No hydronephrosis. The bladder is largely decompressed and is unremarkable. Stomach/Bowel: The stomach, small bowel, and large bowel are unremarkable. Appendix normal. No free intraperitoneal gas or fluid. Vascular/Lymphatic: The abdominal vasculature is unremarkable on this noncontrast examination. No pathologic adenopathy within the abdomen and pelvis. Reproductive: Prostate is unremarkable. Other: Rectum unremarkable Musculoskeletal: Bone island within the left greater trochanter. Degenerative changes are seen within the lumbosacral junction. No lytic or blastic bone lesions are seen. IMPRESSION: No radiographic explanation for the patient's symptoms. Incidental findings as noted above. Electronically Signed   By: Helyn Numbers MD   On: 11/09/2019 02:34   US SCROTUM W/DOPPLER  Result Date: 11/09/2019 CLINICAL DATA:  Palpable abnormality in the left testicle EXAM: SCROTAL ULTRASOUND DOPPLER ULTRASOUND OF THE TESTICLES TECHNIQUE: Complete ultrasound examination of the testicles, epididymis, and other scrotal  structures was performed. Color and spectral Doppler ultrasound were also utilized to evaluate blood flow to the testicles. COMPARISON:  01/01/2017 FINDINGS: Right testicle Measurements: 4.8 x 2.6 x 3.3 cm. No focal mass lesion is noted. Changes of tubular ectasia are seen. Left testicle Measurements: 4.5 x 2.2 x 3.3 cm. No focal mass is noted. Tubular ectasia is noted similar to that seen on the right side. Right epididymis:  Small epididymal cyst is noted. Left epididymis:  Normal in size and appearance. Hydrocele:  Small bilateral hydroceles are noted. Varicocele:  Small varicoceles are noted bilaterally. Pulsed Doppler interrogation of both testes demonstrates normal low resistance arterial and venous waveforms bilaterally. IMPRESSION: Bilateral hydroceles and varicoceles. No palpable abnormality is noted within the testicles. Symmetrical tubular ectasia. This has progressed somewhat in the interval from the prior exam. Electronically Signed   By: Alcide Clever M.D.   On: 11/09/2019 01:00  ____________________________________________   PROCEDURES  Procedure(s) performed (including Critical Care):  Procedures   ____________________________________________   INITIAL IMPRESSION / ASSESSMENT AND PLAN / ED COURSE  Lab work acute including the urinalysis is normal.  His specific gravity is slightly high.  I will get a ultrasound to evaluate the lump in testicle and the tenderness there.  Additionally I will plan on getting a CT to rule out renal stones and hernia.  If this does not give me the etiology of the patient's pain we will give him some pain medicine and refer him back to urology or back to Dr. Sheppard Penton who he had seen before and who he had indicated desire to see again.     ----------------------------------------- 3:05 AM on 11/09/2019 -----------------------------------------  Patient's CT and ultrasound do not explain his pain.  He does have a tender boggy prostate and prostatitis is  probably enough of the reason for him to have the pain.  He has already taken Cipro and is now on a course of Bactrim which she started on the 21st just really a day and a half ago.  I will let him continue with that.  We will have him follow-up with urology to make sure that he is actually improving.  He does not have a kidney stone or testicular torsion.  He does not appear to have any sign of epididymitis or orchitis either.  He does not have a hernia I do not think there is any other reason for his pain other than the prostatitis.         ____________________________________________   FINAL CLINICAL IMPRESSION(S) / ED DIAGNOSES  Final diagnoses:  Inguinal pain, unspecified laterality     ED Discharge Orders         Ordered    oxyCODONE-acetaminophen (PERCOCET) 5-325 MG tablet  Every 4 hours PRN        11/09/19 0303          *Please note:  Allen Chapman was evaluated in Emergency Department on 11/09/2019 for the symptoms described in the history of present illness. He was evaluated in the context of the global COVID-19 pandemic, which necessitated consideration that the patient might be at risk for infection with the SARS-CoV-2 virus that causes COVID-19. Institutional protocols and algorithms that pertain to the evaluation of patients at risk for COVID-19 are in a state of rapid change based on information released by regulatory bodies including the CDC and federal and state organizations. These policies and algorithms were followed during the patient's care in the ED.  Some ED evaluations and interventions may be delayed as a result of limited staffing during and the pandemic.*   Note:  This document was prepared using Dragon voice recognition software and may include unintentional dictation errors.    Arnaldo Natal, MD 11/09/19 [phone]

## 2019-11-08 NOTE — ED Triage Notes (Signed)
Pt presents to ED via POV with c/o dysuria x 3 weeks. Pt states was prescribed medication by urologist approx 1 week ago, pt states pain not any better at this time. Pt states pain to his L groin. Pt states pain worse with walking/movement. Pt states pain starts in his lower back and radiates down into his groin.    Diego # [phone] Video Interpreter used for triage.

## 2019-11-08 NOTE — ED Notes (Signed)
Assessment completed using video interpretor # [id number]

## 2019-11-09 ENCOUNTER — Emergency Department: Payer: 59

## 2019-11-09 ENCOUNTER — Telehealth: Payer: Self-pay

## 2019-11-09 MED ORDER — OXYCODONE-ACETAMINOPHEN 5-325 MG PO TABS
1.0000 | ORAL_TABLET | Freq: Once | ORAL | Status: AC
  Administered 2019-11-09: 1 via ORAL
  Filled 2019-11-09: qty 1

## 2019-11-09 MED ORDER — OXYCODONE-ACETAMINOPHEN 5-325 MG PO TABS: 1.0000 | ORAL_TABLET | ORAL | 0 refills | Status: DC | PRN

## 2019-11-09 NOTE — Telephone Encounter (Signed)
[id number]  Patient called through interpreter services attempted to return call left vmail with interpreter

## 2019-11-09 NOTE — Discharge Instructions (Addendum)
Please continue to take your medicines.  Use the Percocet 1 pill 4 times a day as needed for pain.  Please follow-up with Dr. Apolinar Junes or Dr. Evelene Croon.  They are both good urologists.  Please return for fever, vomiting, worse pain or feeling sicker.

## 2019-11-09 NOTE — ED Notes (Signed)
EDP at bedside providing further explanation of discharge dx and instructions with video interpreter.

## 2019-11-14 ENCOUNTER — Ambulatory Visit (INDEPENDENT_AMBULATORY_CARE_PROVIDER_SITE_OTHER): Payer: 59 | Admitting: Physician Assistant

## 2019-11-14 ENCOUNTER — Encounter: Payer: Self-pay | Admitting: Physician Assistant

## 2019-11-14 ENCOUNTER — Other Ambulatory Visit: Payer: Self-pay

## 2019-11-14 VITALS — BP 146/84 | HR 90 | Ht 66.0 in | Wt 169.7 lb

## 2019-11-14 DIAGNOSIS — N411 Chronic prostatitis: Secondary | ICD-10-CM

## 2019-11-14 LAB — BLADDER SCAN AMB NON-IMAGING

## 2019-11-14 MED ORDER — MIRABEGRON ER 25 MG PO TB24: 25.0000 mg | ORAL_TABLET | Freq: Every day | ORAL | 0 refills | Status: AC

## 2019-11-14 NOTE — Progress Notes (Signed)
11/14/2019 1:05 PM   Allen Chapman [birth year] [id number]  CC: Chief Complaint  Patient presents with  . Prostatitis   HPI: Allen Chapman is a 59 y.o. male with PMH BPH who previously failed Flomax, Cardura, and finasteride; unprovoked PE previously recommended to undergo indefinite anticoagulation on Xarelto who subsequently discontinued the medication; and prostatitis who presents for ED follow-up.    I saw him in clinic most recently on 11/02/2019 for evaluation of frequency, urgency, dysuria, urinary leakage, weak stream, low back pain, lower abdominal pain, chills, and pain with sex.  UA benign at that time.  I started him on alfuzosin and meloxicam for management of chronic prostatitis flare with plans to add empiric Bactrim if his symptoms did not resolve within 1 week.  Patient subsequently contacted the office with continued symptoms.  I started him on Bactrim DS twice daily x28 days 1 week ago.  Patient presented to the emergency department the day after starting antibiotics with reports of ongoing symptoms.  Labs notable for stable creatinine, benign UA, and normal WBC count.  Scrotal ultrasound notable for small bilateral hydroceles and varicoceles and symmetrical tubular ectasia without evidence of solid mass.  CT stone study with bilateral simple cysts and a hyperdense lower pole right kidney cyst, decreased in size and felt consistent with benign finding.  No urolithiasis, no hydronephrosis.  Symptoms were felt to be consistent with prostatitis and he was encouraged to continue the medications that I had previously prescribed; he was also prescribed Percocet 5-325 mg for pain control.  Today he reports continued, unchanged lower abdominal and groin pain.  He has had improvement in nocturia, from 15 episodes to 4 episodes nightly.  He also reports some bladder burning after termination of urination. He reports the pain is exacerbated with walking, during which times he feels that  his left testicle swells. It is not swollen today.  He is taking alfuzosin daily but does not wish to continue it long-term because he feels it is decreasing his sex drive and he is concerned about long-term sexual side effects.  He is taking Bactrim as prescribed.  He stopped meloxicam 2 days ago because he felt it was not helping his symptoms.    He is frustrated by his ongoing symptoms, curious why his prostate is inflamed, and would like to know what we can do to treat the problem.  In-office UA and microscopy today pan-negative. PVR 23mL.  PMH: Past Medical History:  Diagnosis Date  . Erectile dysfunction 07/02/2014  . GERD (gastroesophageal reflux disease)   . Hematuria, microscopic 07/02/2014  . Hypogonadism in male 07/02/2014  . Inguinal hernia, left 07/02/2014  . Kidney stone   . Prostate enlargement     Surgical History: Past Surgical History:  Procedure Laterality Date  . APPENDECTOMY  1980  . CYSTOSCOPY  03/31/2014  . EXTRACORPOREAL SHOCK WAVE LITHOTRIPSY Left 10/10/2015   Procedure: EXTRACORPOREAL SHOCK WAVE LITHOTRIPSY (ESWL);  Surgeon: Hildred Laser, MD;  Location: [facility] ORS;  Service: Urology;  Laterality: Left;  . LITHOTRIPSY      Home Medications:  Allergies as of 11/14/2019      Reactions   Nortriptyline Shortness Of Breath   Flomax [tamsulosin Hcl] Other (See Comments)   Dizziness      Medication List       Accurate as of November 14, 2019  1:05 PM. If you have any questions, ask your nurse or doctor.        alfuzosin 10 MG 24 hr  tablet Commonly known as: UROXATRAL Take 1 tablet (10 mg total) by mouth daily with breakfast.   ciprofloxacin 500 MG tablet Commonly known as: CIPRO Take 500 mg by mouth 2 (two) times daily.   meloxicam 15 MG tablet Commonly known as: MOBIC Take 1 tablet (15 mg total) by mouth daily.   methylPREDNISolone 4 MG Tbpk tablet Commonly known as: MEDROL DOSEPAK 6 day dose pack - take as directed   mirabegron ER 25  MG Tb24 tablet Commonly known as: MYRBETRIQ Take 1 tablet (25 mg total) by mouth daily. Started by: Carman Ching, PA-C   naproxen 500 MG tablet Commonly known as: NAPROSYN Take 500 mg by mouth 2 (two) times daily.   omeprazole 40 MG capsule Commonly known as: PRILOSEC Take 40 mg by mouth daily.   oxyCODONE-acetaminophen 5-325 MG tablet Commonly known as: Percocet Take 1 tablet by mouth every 4 (four) hours as needed for severe pain.   predniSONE 10 MG tablet Commonly known as: DELTASONE Take 10 mg by mouth 2 (two) times daily.   sucralfate 1 g tablet Commonly known as: CARAFATE Take by mouth daily.   sulfamethoxazole-trimethoprim 800-160 MG tablet Commonly known as: BACTRIM DS Take 1 tablet by mouth 2 (two) times daily for 28 days.   temazepam 15 MG capsule Commonly known as: RESTORIL Take 15 mg by mouth at bedtime.   traMADol 50 MG tablet Commonly known as: ULTRAM Take 50 mg by mouth every 8 (eight) hours as needed.   Xarelto 20 MG Tabs tablet Generic drug: rivaroxaban       Allergies:  Allergies  Allergen Reactions  . Nortriptyline Shortness Of Breath  . Flomax [Tamsulosin Hcl] Other (See Comments)    Dizziness    Family History: Family History  Problem Relation Age of Onset  . Bladder Cancer Neg Hx   . Kidney cancer Neg Hx   . Prostate cancer Neg Hx     Social History:   reports that he has never smoked. He has never used smokeless tobacco. He reports that he does not drink alcohol and does not use drugs.  Physical Exam: BP (!) 146/84 (BP Location: Left Arm, Patient Position: Sitting, Cuff Size: Normal)   Pulse 90   Ht 5\' 6"  (1.676 m)   Wt 169 lb 11.2 oz (77 kg)   BMI 27.39 kg/m   Constitutional:  Alert and oriented, no acute distress, nontoxic appearing HEENT: [place], AT Cardiovascular: No clubbing, cyanosis, or edema Respiratory: Normal respiratory effort, no increased work of breathing GU: Bilateral descended testicles without  nodules or induration. Tenderness of the left inguinal ring without palpable hernia with cough. Skin: No rashes, bruises or suspicious lesions Neurologic: Grossly intact, no focal deficits, moving all 4 extremities Psychiatric: Normal mood and affect  Laboratory Data: Results for orders placed or performed in visit on 11/14/19  CULTURE, URINE COMPREHENSIVE   Specimen: Urine   UR  Result Value Ref Range   Urine Culture, Comprehensive Preliminary report    Organism ID, Bacteria Comment   Microscopic Examination   Urine  Result Value Ref Range   WBC, UA 0-5 0 - 5 /hpf   RBC 0-2 0 - 2 /hpf   Epithelial Cells (non renal) None seen 0 - 10 /hpf   Bacteria, UA Few None seen/Few  Urinalysis, Complete  Result Value Ref Range   Specific Gravity, UA >1.030 (H) 1.005 - 1.030   pH, UA 5.0 5.0 - 7.5   Color, UA Yellow Yellow   Appearance Ur  Clear Clear   Leukocytes,UA Negative Negative   Protein,UA Negative Negative/Trace   Glucose, UA Negative Negative   Ketones, UA Negative Negative   RBC, UA Negative Negative   Bilirubin, UA Negative Negative   Urobilinogen, Ur 0.2 0.2 - 1.0 mg/dL   Nitrite, UA Negative Negative   Microscopic Examination See below:   BLADDER SCAN AMB NON-IMAGING  Result Value Ref Range   Scan Result 20mL    Assessment & Plan:   1. Chronic prostatitis Improved nocturia, however continued lower abdominal and groin pain.  It may be too soon for him to experience symptomatic relief on empiric Bactrim, as he only started this 1 week ago.  Additionally, he stopped meloxicam 2 days ago.  I counseled the patient that it is very important that he continue the medications he was prescribed, as it may take time to resolve his symptoms.  I counseled him to continue alfuzosin and Bactrim and restart meloxicam.  Additionally, adding Myrbetriq 25 mg daily x28 days for management of possible bladder spasms.  Samples provided today.  UA benign again today.  Will send for culture for  further evaluation, however suspect this will be negative in the setting of Bactrim.  Patient was noted to have bilateral varicoceles on recent scrotal ultrasound, however they are small in size and minimally palpable on physical exam today.  I cannot rule out symptomatic varicocele as contributory to his picture, however patient remains a poor surgical candidate while off recommended anticoagulation.  I offered the patient referral to pelvic floor PT today. He is in agreement with this plan. I explained that there will be a delay in starting his treatment at [facility] due to a known patient backlog, however patient states he prefers to get his care at this facility and would like to be seen here. Referral placed today. - Urinalysis, Complete - BLADDER SCAN AMB NON-IMAGING - CULTURE, URINE COMPREHENSIVE - mirabegron ER (MYRBETRIQ) 25 MG TB24 tablet; Take 1 tablet (25 mg total) by mouth daily.  Dispense: 28 tablet; Refill: 0 - Ambulatory referral to Physical Therapy   Return in about 4 weeks (around 12/12/2019) for Symptom recheck with Dr. Apolinar Junes.   I spent 40 minutes on the day of the encounter to include pre-visit record review, face-to-face time with the patient, and post-visit ordering of tests.   Carman Ching, PA-C  [facility] [address] [place], [place] [address] [phone]

## 2019-11-15 LAB — URINALYSIS, COMPLETE
Bilirubin, UA: NEGATIVE
Glucose, UA: NEGATIVE
Ketones, UA: NEGATIVE
Leukocytes,UA: NEGATIVE
Nitrite, UA: NEGATIVE
Protein,UA: NEGATIVE
RBC, UA: NEGATIVE
Specific Gravity, UA: >1.03 — ABNORMAL HIGH (ref 1.005–1.030)
Urobilinogen, Ur: 0.2 mg/dL (ref 0.2–1.0)
pH, UA: 5 (ref 5.0–7.5)

## 2019-11-15 LAB — MICROSCOPIC EXAMINATION: Epithelial Cells (non renal): NONE SEEN /hpf (ref 0–10)

## 2019-11-17 LAB — CULTURE, URINE COMPREHENSIVE

## 2019-12-19 ENCOUNTER — Ambulatory Visit: Payer: Self-pay | Admitting: Urology

## 2020-02-24 ENCOUNTER — Emergency Department
Admission: EM | Admit: 2020-02-24 | Discharge: 2020-02-24 | Disposition: A | Discharge status: Home / Self Care | Payer: 59 | Attending: Emergency Medicine | Admitting: Emergency Medicine

## 2020-02-24 ENCOUNTER — Other Ambulatory Visit: Payer: Self-pay

## 2020-02-24 DIAGNOSIS — Z7901 Long term (current) use of anticoagulants: Secondary | ICD-10-CM | POA: Insufficient documentation

## 2020-02-24 DIAGNOSIS — R000 Tachycardia, unspecified: Secondary | ICD-10-CM | POA: Diagnosis not present

## 2020-02-24 DIAGNOSIS — A084 Viral intestinal infection, unspecified: Secondary | ICD-10-CM | POA: Diagnosis not present

## 2020-02-24 DIAGNOSIS — I10 Essential (primary) hypertension: Secondary | ICD-10-CM | POA: Diagnosis not present

## 2020-02-24 DIAGNOSIS — R197 Diarrhea, unspecified: Secondary | ICD-10-CM | POA: Diagnosis present

## 2020-02-24 LAB — CBC
HCT: 47.9 % (ref 39.0–52.0)
Hemoglobin: 16.4 g/dL (ref 13.0–17.0)
MCH: 27.4 pg (ref 26.0–34.0)
MCHC: 34.2 g/dL (ref 30.0–36.0)
MCV: 80 fL (ref 80.0–100.0)
Platelets: 220 10*3/uL (ref 150–400)
RBC: 5.99 MIL/uL — ABNORMAL HIGH (ref 4.22–5.81)
RDW: 13.2 % (ref 11.5–15.5)
WBC: 3.3 10*3/uL — ABNORMAL LOW (ref 4.0–10.5)
nRBC: 0 % (ref 0.0–0.2)

## 2020-02-24 LAB — COMPREHENSIVE METABOLIC PANEL
ALT: 24 U/L (ref 0–44)
AST: 22 U/L (ref 15–41)
Albumin: 3.8 g/dL (ref 3.5–5.0)
Alkaline Phosphatase: 59 U/L (ref 38–126)
Anion gap: 12 (ref 5–15)
BUN: 11 mg/dL (ref 6–20)
CO2: 22 mmol/L (ref 22–32)
Calcium: 8.9 mg/dL (ref 8.9–10.3)
Chloride: 103 mmol/L (ref 98–111)
Creatinine, Ser: 0.78 mg/dL (ref 0.61–1.24)
GFR, Estimated: >60 mL/min (ref >60)
Glucose, Bld: 109 mg/dL — ABNORMAL HIGH (ref 70–99)
Potassium: 3.7 mmol/L (ref 3.5–5.1)
Sodium: 137 mmol/L (ref 135–145)
Total Bilirubin: 1.2 mg/dL (ref 0.3–1.2)
Total Protein: 7.3 g/dL (ref 6.5–8.1)

## 2020-02-24 LAB — LIPASE, BLOOD: Lipase: 22 U/L (ref 11–51)

## 2020-02-24 MED ORDER — LACTATED RINGERS IV BOLUS
1000.0000 mL | Freq: Once | INTRAVENOUS | Status: AC
  Administered 2020-02-24: 1000 mL via INTRAVENOUS

## 2020-02-24 MED ORDER — METRONIDAZOLE 500 MG PO TABS: 500.0000 mg | ORAL_TABLET | Freq: Two times a day (BID) | ORAL | 0 refills | Status: DC

## 2020-02-24 NOTE — ED Notes (Signed)
Spoke with dr. Katrinka Blazing regarding pt's presenting symptoms and if lactic acid level needed. No new order for lactic acid received, order for LR bolus received.

## 2020-02-24 NOTE — ED Notes (Signed)
Triage assist with spanish interpreter alan [phone].Marland Kitchen

## 2020-02-24 NOTE — ED Triage Notes (Signed)
Pt states upper abd pain and diarrhea for over a week. Pt states was tested for covid and was negative. Pt states has had a fever at home.

## 2020-02-24 NOTE — ED Notes (Signed)
Pt states upper abd pain and diarrhea for over a week. Pt states was tested for covid and was negative. Pt states has had a fever at home. Patient says his wife has the same

## 2020-02-24 NOTE — ED Provider Notes (Signed)
[facility] Emergency Department Provider Note   ____________________________________________    I have reviewed the triage vital signs and the nursing notes.   HISTORY  Chief Complaint Diarrhea   Spanish interpreter used  HPI Allen Chapman is a 60 y.o. male with history as noted below who presents with complaints of 3 days of diarrhea and abdominal cramping and mild epigastric discomfort.  He reports he did have fever earlier in the week.  He reports he had a negative Covid PCR test done as well.  Has not taken anything for this.  Did have antibiotics that he finished 1 to 2 weeks ago.  Denies foul-smelling stool.  Is frustrated that he can't get back to work because of diarrhea.  Past Medical History:  Diagnosis Date  . Erectile dysfunction 07/02/2014  . GERD (gastroesophageal reflux disease)   . Hematuria, microscopic 07/02/2014  . Hypogonadism in male 07/02/2014  . Inguinal hernia, left 07/02/2014  . Kidney stone   . Prostate enlargement     Patient Active Problem List   Diagnosis Date Noted  . Adductor tendinitis 01/23/2019  . HTN (hypertension) 09/10/2015  . Plantar fasciitis of left foot 09/10/2015  . Chest wall pain 04/03/2015  . Gastritis and gastroduodenitis 04/03/2015  . Insomnia 04/03/2015  . Snoring 04/03/2015  . Chronic insomnia 10/31/2014  . Elevated BP 10/31/2014  . Gastroesophageal reflux disease without esophagitis 10/31/2014  . Erectile dysfunction 07/02/2014  . Hypogonadism in male 07/02/2014  . Difficult or painful urination 05/19/2014  . Enlarged prostate 05/19/2014  . ED (erectile dysfunction) of organic origin 05/19/2014  . 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 Hernia, inguinal 05/19/2014  . Hematuria, microscopic 05/19/2014  . Prostatitis 05/19/2014  . Calculus of kidney 05/19/2014  . Diarrhea 01/17/2014  . Excessive urination at night 07/01/2012  . Must strain to pass urine 07/01/2012  . Urgency of micturation  07/01/2012  . FOM (frequency of micturition) 07/01/2012    Past Surgical History:  Procedure Laterality Date  . APPENDECTOMY  1980  . CYSTOSCOPY  03/31/2014  . EXTRACORPOREAL SHOCK WAVE LITHOTRIPSY Left 10/10/2015   Procedure: EXTRACORPOREAL SHOCK WAVE LITHOTRIPSY (ESWL);  Surgeon: Hildred Laser, MD;  Location: [facility] ORS;  Service: Urology;  Laterality: Left;  . LITHOTRIPSY      Prior to Admission medications   Medication Sig Start Date End Date Taking? Authorizing Provider  metroNIDAZOLE (FLAGYL) 500 MG tablet Take 1 tablet (500 mg total) by mouth 2 (two) times daily after a meal. 02/24/20  Yes Jene Every, MD  alfuzosin (UROXATRAL) 10 MG 24 hr tablet Take 1 tablet (10 mg total) by mouth daily with breakfast. 11/02/19   Vaillancourt, Lelon Mast, PA-C  ciprofloxacin (CIPRO) 500 MG tablet Take 500 mg by mouth 2 (two) times daily. Patient not taking: Reported on 11/14/2019 10/31/19   [provider]  meloxicam (MOBIC) 15 MG tablet Take 1 tablet (15 mg total) by mouth daily. Patient not taking: Reported on 11/14/2019 11/02/19   Carman Ching, PA-C  methylPREDNISolone (MEDROL DOSEPAK) 4 MG TBPK tablet 6 day dose pack - take as directed Patient not taking: Reported on 11/14/2019 08/07/19   Hyatt, Max T, DPM  mirabegron ER (MYRBETRIQ) 25 MG TB24 tablet Take 1 tablet (25 mg total) by mouth daily. 11/14/19   Vaillancourt, Lelon Mast, PA-C  naproxen (NAPROSYN) 500 MG tablet Take 500 mg by mouth 2 (two) times daily.  Patient not taking: Reported on 11/14/2019 04/24/19   [provider]  omeprazole (PRILOSEC) 40 MG capsule Take  40 mg by mouth daily.    [provider]  oxyCODONE-acetaminophen (PERCOCET) 5-325 MG tablet Take 1 tablet by mouth every 4 (four) hours as needed for severe pain. 11/09/19 11/08/20  Arnaldo Natal, MD  predniSONE (DELTASONE) 10 MG tablet Take 10 mg by mouth 2 (two) times daily.  Patient not taking: Reported on 11/14/2019 05/17/19   [provider]  rivaroxaban (XARELTO) 20 MG TABS tablet  03/04/19   [provider]  sucralfate (CARAFATE) 1 g tablet Take by mouth daily.  10/11/19   [provider]  temazepam (RESTORIL) 15 MG capsule Take 15 mg by mouth at bedtime. 07/21/15   [provider]  traMADol (ULTRAM) 50 MG tablet Take 50 mg by mouth every 8 (eight) hours as needed.  Patient not taking: Reported on 11/14/2019 04/02/19   [provider]     Allergies Nortriptyline and Flomax [tamsulosin hcl]  Family History  Problem Relation Age of Onset  . Bladder Cancer Neg Hx   . Kidney cancer Neg Hx   . Prostate cancer Neg Hx     Social History Social History   Tobacco Use  . Smoking status: Never Smoker  . Smokeless tobacco: Never Used  Vaping Use  . Vaping Use: Never used  Substance Use Topics  . Alcohol use: No    Alcohol/week: 0.0 standard drinks  . Drug use: No    Review of Systems  Constitutional: No fever/chills Eyes: No visual changes.  ENT: No sore throat. Cardiovascular: Denies chest pain. Respiratory: Denies shortness of breath. Gastrointestinal: No abdominal pain.  No nausea, no vomiting.   Genitourinary: Negative for dysuria. Musculoskeletal: Negative for back pain. Skin: Negative for rash. Neurological: Negative for headaches or weakness   ____________________________________________   PHYSICAL EXAM:  VITAL SIGNS: ED Triage Vitals  Enc Vitals Group     BP 02/24/20 0557 (!) 148/90     Pulse Rate 02/24/20 0557 (!) 122     Resp 02/24/20 0557 20     Temp 02/24/20 0557 99.7 F (37.6 C)     Temp Source 02/24/20 0557 Oral     SpO2 02/24/20 0557 100 %     Weight 02/24/20 0558 76.2 kg (168 lb)     Height 02/24/20 0558 1.651 m (5\' 5" )     Head Circumference --      Peak Flow --      Pain Score 02/24/20 0558 8     Pain Loc --      Pain Edu? --      Excl. in GC? --     Constitutional: Alert and oriented.   Nose: No  congestion/rhinnorhea. Mouth/Throat: Mucous membranes are moist.    Cardiovascular: Mild tachycardia regular rhythm. Grossly normal heart sounds.  Good peripheral circulation. Respiratory: Normal respiratory effort.  No retractions. Lungs CTAB. Gastrointestinal: Soft and nontender. No distention.  No CVA tenderness.  Reassuring exam  Musculoskeletal:   Warm and well perfused Neurologic:  Normal speech and language. No gross focal neurologic deficits are appreciated.  Skin:  Skin is warm, dry and intact. No rash noted. Psychiatric: Mood and affect are normal. Speech and behavior are normal.  ____________________________________________   LABS (all labs ordered are listed, but only abnormal results are displayed)  Labs Reviewed  COMPREHENSIVE METABOLIC PANEL - Abnormal; Notable for the following components:      Result Value   Glucose, Bld 109 (*)    All other components within normal limits  CBC - Abnormal; Notable  for the following components:   WBC 3.3 (*)    RBC 5.99 (*)    All other components within normal limits  LIPASE, BLOOD  URINALYSIS, COMPLETE (UACMP) WITH MICROSCOPIC   ____________________________________________  EKG  ED ECG REPORT I, Jene Every, the attending physician, personally viewed and interpreted this ECG.  Date: 02/24/2020  Rhythm: Sinus tachycardia QRS Axis: normal Intervals: normal ST/T Wave abnormalities: normal Narrative Interpretation: no evidence of acute ischemia  ____________________________________________  RADIOLOGY  None ____________________________________________   PROCEDURES  Procedure(s) performed: No  Procedures   Critical Care performed: No ____________________________________________   INITIAL IMPRESSION / ASSESSMENT AND PLAN / ED COURSE  Pertinent labs & imaging results that were available during my care of the patient were reviewed by me and considered in my medical decision making (see chart for  details).  Patient presents with diarrhea abdominal cramping as described above.  Differential includes viral gastroenteritis, antibiotic associated diarrhea, less likely C. difficile given no foul-smelling diarrhea.  Patient describes it as watery.  No recent travel  Lab work is overall reassuring, tachycardic upon arrival, improved significantly after IV fluids  Patient apparently gave stool sample to PCP yesterday, encouraged him to wait for stool results before starting any antibiotics.    ____________________________________________   FINAL CLINICAL IMPRESSION(S) / ED DIAGNOSES  Final diagnoses:  Viral gastroenteritis        Note:  This document was prepared using Dragon voice recognition software and may include unintentional dictation errors.   Jene Every, MD 02/24/20 1006

## 2020-04-01 ENCOUNTER — Other Ambulatory Visit
Admission: RE | Admit: 2020-04-01 | Discharge: 2020-04-01 | Disposition: A | Discharge status: Home / Self Care | Payer: 59 | Source: Ambulatory Visit | Attending: Physician Assistant | Admitting: Physician Assistant

## 2020-04-01 ENCOUNTER — Other Ambulatory Visit: Payer: Self-pay | Admitting: Physician Assistant

## 2020-04-01 ENCOUNTER — Ambulatory Visit
Admission: RE | Admit: 2020-04-01 | Discharge: 2020-04-01 | Disposition: A | Discharge status: Home / Self Care | Payer: 59 | Source: Ambulatory Visit | Attending: Physician Assistant | Admitting: Physician Assistant

## 2020-04-01 ENCOUNTER — Other Ambulatory Visit (HOSPITAL_COMMUNITY): Payer: Self-pay | Admitting: Physician Assistant

## 2020-04-01 ENCOUNTER — Other Ambulatory Visit: Payer: Self-pay

## 2020-04-01 DIAGNOSIS — U071 COVID-19: Secondary | ICD-10-CM | POA: Diagnosis present

## 2020-04-01 DIAGNOSIS — R7989 Other specified abnormal findings of blood chemistry: Secondary | ICD-10-CM | POA: Diagnosis present

## 2020-04-01 DIAGNOSIS — J069 Acute upper respiratory infection, unspecified: Secondary | ICD-10-CM | POA: Insufficient documentation

## 2020-04-01 DIAGNOSIS — R0789 Other chest pain: Secondary | ICD-10-CM | POA: Diagnosis present

## 2020-04-01 LAB — FIBRIN DERIVATIVES D-DIMER (ARMC ONLY): Fibrin derivatives D-dimer (ARMC): 1013.97 ng/mL (FEU) — ABNORMAL HIGH (ref 0.00–499.00)

## 2020-04-01 MED ORDER — IOHEXOL 350 MG/ML SOLN
75.0000 mL | Freq: Once | INTRAVENOUS | Status: AC | PRN
  Administered 2020-04-01: 75 mL via INTRAVENOUS

## 2020-04-26 ENCOUNTER — Encounter: Payer: Self-pay | Admitting: Podiatry

## 2020-04-26 ENCOUNTER — Ambulatory Visit (INDEPENDENT_AMBULATORY_CARE_PROVIDER_SITE_OTHER): Payer: 59 | Admitting: Podiatry

## 2020-04-26 DIAGNOSIS — M722 Plantar fascial fibromatosis: Secondary | ICD-10-CM | POA: Diagnosis not present

## 2020-04-26 DIAGNOSIS — M7671 Peroneal tendinitis, right leg: Secondary | ICD-10-CM

## 2020-04-26 DIAGNOSIS — M7672 Peroneal tendinitis, left leg: Secondary | ICD-10-CM

## 2020-04-26 MED ORDER — OXYCODONE-ACETAMINOPHEN 5-325 MG PO TABS: 1.0000 | ORAL_TABLET | Freq: Every evening | ORAL | 0 refills | Status: DC

## 2020-04-26 MED ORDER — TRAMADOL HCL 50 MG PO TABS
50.0000 mg | ORAL_TABLET | Freq: Three times a day (TID) | ORAL | 0 refills | Status: AC | PRN
Start: 2020-04-26 — End: ?

## 2020-04-26 MED ORDER — NAPROXEN 500 MG PO TABS: 500.0000 mg | ORAL_TABLET | Freq: Two times a day (BID) | ORAL | 1 refills | Status: AC

## 2020-04-26 NOTE — Progress Notes (Signed)
° °  HPI: 60 y.o. male presenting today to the office for evaluation of severe pain and tenderness to the left foot and ankle that extends up to his leg.  Patient states that he works at [employer] on his feet all day long.  He experiences significant pain and tenderness to his bilateral feet left greater than right.  This is been ongoing for approximately 4 years now.  He has taken tramadol and naproxen in the past which have helped when taken in combination.  He states that by the end of the day his feet are in severe pain.  He presents for further treatment and evaluation  Past Medical History:  Diagnosis Date   Erectile dysfunction 07/02/2014   GERD (gastroesophageal reflux disease)    Hematuria, microscopic 07/02/2014   Hypogonadism in male 07/02/2014   Inguinal hernia, left 07/02/2014   Kidney stone    Prostate enlargement      Physical Exam: General: The patient is alert and oriented x3 in no acute distress.  Dermatology: Skin is warm, dry and supple bilateral lower extremities. Negative for open lesions or macerations.  Vascular: Palpable pedal pulses bilaterally. No edema or erythema noted. Capillary refill within normal limits.  Neurological: Epicritic and protective threshold grossly intact bilaterally.   Musculoskeletal Exam: Range of motion within normal limits to all pedal and ankle joints bilateral. Muscle strength 5/5 in all groups bilateral.  Pain on palpation along the peroneal tendon sheaths especially of the left peroneal tendon sheath as it passes the posterior aspect of the fibular malleolus. There is also severe pain and tenderness along the medial longitudinal arch of the foot left.  Assessment: 1.  Peroneal tendinitis bilateral left greater than right 2.  Plantar fasciitis bilateral left greater than right 3.  Chronic severe foot and ankle pain left   Plan of Care:  1. Patient evaluated.  2.  Patient states he received injections in the past  which did not help alleviate any of his symptoms.  No injections provided today 3.  Prescription for tramadol and naproxen to take throughout the day as needed 4.  Prescription for Percocet 5/325 mg nightly 5.  OTC power step insoles were provided for the patient to help alleviate his pain.  Wear during the day 6.  The patient has now had this pain for several years and it is currently severe despite conservative treatment modalities.  Order placed for an MRI left ankle 7.  Return to clinic after MRI to review results  *Dominican      Felecia Shelling, DPM [facility]  Dr. Felecia Shelling, DPM    [address] [place], [place] [address]                Office [phone]  Fax [phone]

## 2020-05-14 ENCOUNTER — Telehealth: Payer: Self-pay | Admitting: Podiatry

## 2020-05-14 ENCOUNTER — Other Ambulatory Visit: Payer: Self-pay

## 2020-05-14 ENCOUNTER — Encounter: Payer: Self-pay | Admitting: *Deleted

## 2020-05-14 ENCOUNTER — Ambulatory Visit (INDEPENDENT_AMBULATORY_CARE_PROVIDER_SITE_OTHER): Payer: 59 | Admitting: Podiatry

## 2020-05-14 DIAGNOSIS — M722 Plantar fascial fibromatosis: Secondary | ICD-10-CM | POA: Diagnosis not present

## 2020-05-14 DIAGNOSIS — G609 Hereditary and idiopathic neuropathy, unspecified: Secondary | ICD-10-CM

## 2020-05-14 DIAGNOSIS — M778 Other enthesopathies, not elsewhere classified: Secondary | ICD-10-CM | POA: Diagnosis not present

## 2020-05-14 DIAGNOSIS — M7672 Peroneal tendinitis, left leg: Secondary | ICD-10-CM | POA: Diagnosis not present

## 2020-05-14 MED ORDER — GABAPENTIN 100 MG PO CAPS: 100.0000 mg | ORAL_CAPSULE | Freq: Three times a day (TID) | ORAL | 3 refills | Status: AC

## 2020-05-14 MED ORDER — BETAMETHASONE SOD PHOS & ACET 6 (3-3) MG/ML IJ SUSP
3.0000 mg | Freq: Once | INTRAMUSCULAR | Status: AC
  Administered 2020-05-14: 3 mg via INTRA_ARTICULAR

## 2020-05-14 NOTE — Progress Notes (Signed)
   HPI: 60 y.o. male presenting today to the office for follow-up evaluation of severe pain and tenderness to the left foot and ankle that extends up to his leg.  Patient states that he works at [employer] on his feet all day long.  He experiences significant pain and tenderness to his bilateral feet left greater than right.  This is been ongoing for approximately 4 years now.  He has taken tramadol and naproxen in the past which have helped when taken in combination.  He states that by the end of the day his feet are in severe pain.   Patient states that since last visit on 04/26/2020 he has had increased pain and tenderness bilateral.  Nothing helps to alleviate his pain.  He presents for further treatment evaluation as an urgent work in  Past Medical History:  Diagnosis Date  . Erectile dysfunction 07/02/2014  . GERD (gastroesophageal reflux disease)   . Hematuria, microscopic 07/02/2014  . Hypogonadism in male 07/02/2014  . Inguinal hernia, left 07/02/2014  . Kidney stone   . Prostate enlargement      Physical Exam: General: The patient is alert and oriented x3 in no acute distress.  Dermatology: Skin is warm, dry and supple bilateral lower extremities. Negative for open lesions or macerations.  Vascular: Palpable pedal pulses bilaterally. No edema or erythema noted. Capillary refill within normal limits.  Neurological: Epicritic and protective threshold grossly intact bilaterally.   Musculoskeletal Exam: Range of motion within normal limits to all pedal and ankle joints bilateral. Muscle strength 5/5 in all groups bilateral.  Pain on palpation along the peroneal tendon sheaths especially of the left peroneal tendon sheath as it passes the posterior aspect of the fibular malleolus.  Pain on palpation to the midtarsal joint left foot There is also severe pain and tenderness along the medial longitudinal arch of the foot bilateral  Assessment: 1.  Peroneal tendinitis bilateral  left greater than right 2.  Plantar fasciitis bilateral left greater than right 3.  Chronic severe foot and ankle pain left 4.  Midtarsal capsulitis left 5.  Suspect peripheral neuropathy bilateral   Plan of Care:  1. Patient evaluated.  2.  Injection of 0.5 cc Celestone Soluspan injected into the peroneal tendon left, bilateral plantar fascia mid substance, and midtarsal joint of the left foot 3.  Continue tramadol and naproxen as needed 4.  Discontinue Percocet 5/325 mg.  Patient experienced nausea and vomiting.  5.  Discontinue OTC power step insoles.  Patient states the only aggravated his pain more 6.  MRI pending 7.  Prescription for gabapentin 100 mg 3 times daily to see if this helps alleviate in the pain that may be of neuropathic nature  8.  Note for work was provided today.  No work x1 week  9.  Return to clinic after MRI to review results  *Dominican      Felecia Shelling, DPM [facility]  Dr. Felecia Shelling, DPM    [address] [place], [place] [address]                Office [phone]  Fax [phone]

## 2020-05-14 NOTE — Telephone Encounter (Signed)
Patient calling to inform Dr. Logan Bores no one from the imaging center has called to set up an appointment for his MRI.

## 2020-05-16 ENCOUNTER — Telehealth: Payer: Self-pay | Admitting: Podiatry

## 2020-05-16 NOTE — Telephone Encounter (Signed)
Please advise 

## 2020-05-16 NOTE — Telephone Encounter (Signed)
Allen Chapman left a voicemail stating the medication that was prescribe is not working he is still in pain and having headaches. He want to know if there is anything else he can take or that can be prescribe.

## 2020-05-17 ENCOUNTER — Ambulatory Visit: Payer: 59 | Admitting: Podiatry

## 2020-06-18 ENCOUNTER — Encounter: Payer: Self-pay | Admitting: *Deleted

## 2020-08-27 ENCOUNTER — Emergency Department: Payer: 59

## 2020-08-27 ENCOUNTER — Emergency Department
Admission: EM | Admit: 2020-08-27 | Discharge: 2020-08-27 | Disposition: A | Discharge status: Home / Self Care | Payer: 59 | Attending: Emergency Medicine | Admitting: Emergency Medicine

## 2020-08-27 ENCOUNTER — Other Ambulatory Visit: Payer: Self-pay

## 2020-08-27 DIAGNOSIS — Z20822 Contact with and (suspected) exposure to covid-19: Secondary | ICD-10-CM | POA: Diagnosis not present

## 2020-08-27 DIAGNOSIS — I10 Essential (primary) hypertension: Secondary | ICD-10-CM | POA: Diagnosis not present

## 2020-08-27 DIAGNOSIS — R000 Tachycardia, unspecified: Secondary | ICD-10-CM | POA: Diagnosis not present

## 2020-08-27 DIAGNOSIS — B349 Viral infection, unspecified: Secondary | ICD-10-CM

## 2020-08-27 DIAGNOSIS — R519 Headache, unspecified: Secondary | ICD-10-CM | POA: Diagnosis present

## 2020-08-27 DIAGNOSIS — Z79899 Other long term (current) drug therapy: Secondary | ICD-10-CM | POA: Insufficient documentation

## 2020-08-27 LAB — URINALYSIS, COMPLETE (UACMP) WITH MICROSCOPIC
Bacteria, UA: NONE SEEN
Bilirubin Urine: NEGATIVE
Glucose, UA: NEGATIVE mg/dL
Hgb urine dipstick: NEGATIVE
Ketones, ur: NEGATIVE mg/dL
Leukocytes,Ua: NEGATIVE
Nitrite: NEGATIVE
Protein, ur: NEGATIVE mg/dL
Specific Gravity, Urine: 1.025 (ref 1.005–1.030)
Squamous Epithelial / HPF: NONE SEEN (ref 0–5)
pH: 5 (ref 5.0–8.0)

## 2020-08-27 LAB — CBC WITH DIFFERENTIAL/PLATELET
Abs Immature Granulocytes: 0.03 10*3/uL (ref 0.00–0.07)
Basophils Absolute: 0 10*3/uL (ref 0.0–0.1)
Basophils Relative: 0 %
Eosinophils Absolute: 0 10*3/uL (ref 0.0–0.5)
Eosinophils Relative: 0 %
HCT: 41.8 % (ref 39.0–52.0)
Hemoglobin: 14.6 g/dL (ref 13.0–17.0)
Immature Granulocytes: 0 %
Lymphocytes Relative: 10 %
Lymphs Abs: 0.8 10*3/uL (ref 0.7–4.0)
MCH: 28.2 pg (ref 26.0–34.0)
MCHC: 34.9 g/dL (ref 30.0–36.0)
MCV: 80.9 fL (ref 80.0–100.0)
Monocytes Absolute: 0.5 10*3/uL (ref 0.1–1.0)
Monocytes Relative: 6 %
Neutro Abs: 6.5 10*3/uL (ref 1.7–7.7)
Neutrophils Relative %: 84 %
Platelets: 173 10*3/uL (ref 150–400)
RBC: 5.17 MIL/uL (ref 4.22–5.81)
RDW: 13.5 % (ref 11.5–15.5)
WBC: 7.8 10*3/uL (ref 4.0–10.5)
nRBC: 0 % (ref 0.0–0.2)

## 2020-08-27 LAB — COMPREHENSIVE METABOLIC PANEL
ALT: 27 U/L (ref 0–44)
AST: 27 U/L (ref 15–41)
Albumin: 3.8 g/dL (ref 3.5–5.0)
Alkaline Phosphatase: 52 U/L (ref 38–126)
Anion gap: 5 (ref 5–15)
BUN: 14 mg/dL (ref 6–20)
CO2: 25 mmol/L (ref 22–32)
Calcium: 8.5 mg/dL — ABNORMAL LOW (ref 8.9–10.3)
Chloride: 107 mmol/L (ref 98–111)
Creatinine, Ser: 0.78 mg/dL (ref 0.61–1.24)
GFR, Estimated: >60 mL/min (ref >60)
Glucose, Bld: 119 mg/dL — ABNORMAL HIGH (ref 70–99)
Potassium: 3.3 mmol/L — ABNORMAL LOW (ref 3.5–5.1)
Sodium: 137 mmol/L (ref 135–145)
Total Bilirubin: 0.9 mg/dL (ref 0.3–1.2)
Total Protein: 6.5 g/dL (ref 6.5–8.1)

## 2020-08-27 LAB — RESP PANEL BY RT-PCR (FLU A&B, COVID) ARPGX2
Influenza A by PCR: NEGATIVE
Influenza B by PCR: NEGATIVE
SARS Coronavirus 2 by RT PCR: NEGATIVE

## 2020-08-27 LAB — LACTIC ACID, PLASMA: Lactic Acid, Venous: 1.2 mmol/L (ref 0.5–1.9)

## 2020-08-27 MED ORDER — IBUPROFEN 600 MG PO TABS
600.0000 mg | ORAL_TABLET | Freq: Once | ORAL | Status: AC
  Administered 2020-08-27: 600 mg via ORAL
  Filled 2020-08-27: qty 1

## 2020-08-27 MED ORDER — DIPHENHYDRAMINE HCL 50 MG/ML IJ SOLN
50.0000 mg | Freq: Once | INTRAMUSCULAR | Status: AC
  Administered 2020-08-27: 50 mg via INTRAVENOUS
  Filled 2020-08-27: qty 1

## 2020-08-27 MED ORDER — KETOROLAC TROMETHAMINE 30 MG/ML IJ SOLN
30.0000 mg | Freq: Once | INTRAMUSCULAR | Status: AC
  Administered 2020-08-27: 30 mg via INTRAVENOUS
  Filled 2020-08-27: qty 1

## 2020-08-27 MED ORDER — ACETAMINOPHEN 325 MG PO TABS
650.0000 mg | ORAL_TABLET | Freq: Once | ORAL | Status: AC | PRN
  Administered 2020-08-27: 650 mg via ORAL
  Filled 2020-08-27: qty 2

## 2020-08-27 MED ORDER — ONDANSETRON HCL 4 MG/2ML IJ SOLN
4.0000 mg | Freq: Once | INTRAMUSCULAR | Status: AC
  Administered 2020-08-27: 4 mg via INTRAVENOUS
  Filled 2020-08-27: qty 2

## 2020-08-27 MED ORDER — PSEUDOEPH-BROMPHEN-DM 30-2-10 MG/5ML PO SYRP: 10.0000 mL | ORAL_SOLUTION | Freq: Four times a day (QID) | ORAL | 0 refills | Status: AC | PRN

## 2020-08-27 MED ORDER — SODIUM CHLORIDE 0.9 % IV BOLUS
1000.0000 mL | Freq: Once | INTRAVENOUS | Status: AC
  Administered 2020-08-27: 1000 mL via INTRAVENOUS

## 2020-08-27 MED ORDER — PREDNISONE 50 MG PO TABS: 50.0000 mg | ORAL_TABLET | Freq: Every day | ORAL | 0 refills | Status: DC

## 2020-08-27 MED ORDER — ALBUTEROL SULFATE HFA 108 (90 BASE) MCG/ACT IN AERS: 2.0000 | INHALATION_SPRAY | Freq: Four times a day (QID) | RESPIRATORY_TRACT | 0 refills | Status: AC | PRN

## 2020-08-27 MED ORDER — ONDANSETRON 4 MG PO TBDP: 4.0000 mg | ORAL_TABLET | Freq: Three times a day (TID) | ORAL | 0 refills | Status: AC | PRN

## 2020-08-27 NOTE — ED Triage Notes (Signed)
First Nurse Note:  Arrives from [address] [facility] for evaluation of dizziness.  Patient is AAOx3.  Skin warm and dry. NAD

## 2020-08-27 NOTE — ED Notes (Signed)
Patient transported to CT 

## 2020-08-27 NOTE — ED Triage Notes (Signed)
Pt sent from [facility] with c/o dizziness, c/o body aches, HA , fever chills since this morning.

## 2020-08-27 NOTE — ED Provider Notes (Signed)
[facility] Emergency Department Provider Note  ____________________________________________  Time seen: Approximately 4:51 PM  I have reviewed the triage vital signs and the nursing notes.   HISTORY  Chief Complaint URI    HPI Allen Chapman is a 60 y.o. male who presents the emergency department complaining of sudden onset of fevers, chills, nasal congestion, sore throat, cough, body aches.  Patient is also endorsing a headache with the additional symptoms.  This all has started suddenly.  Patient denies any recent sick contacts.  He has had both his flu and COVID vaccines.  No medications prior to arrival.  Patient endorses a headache but denied any unilateral weakness.  No visual changes.  No neck pain or stiffness, chest pain, shortness of breath.  No abdominal pain but he does have some nausea.  No diarrhea or constipation.  No urinary symptoms.       Past Medical History:  Diagnosis Date   Erectile dysfunction 07/02/2014   GERD (gastroesophageal reflux disease)    Hematuria, microscopic 07/02/2014   Hypogonadism in male 07/02/2014   Inguinal hernia, left 07/02/2014   Kidney stone    Prostate enlargement     Patient Active Problem List   Diagnosis Date Noted   Adductor tendinitis 01/23/2019   HTN (hypertension) 09/10/2015   Plantar fasciitis of left foot 09/10/2015   Chest wall pain 04/03/2015   Gastritis and gastroduodenitis 04/03/2015   Insomnia 04/03/2015   Snoring 04/03/2015   Chronic insomnia 10/31/2014   Elevated BP 10/31/2014   Gastroesophageal reflux disease without esophagitis 10/31/2014   Erectile dysfunction 07/02/2014   Hypogonadism in male 07/02/2014   Difficult or painful urination 05/19/2014   Enlarged prostate 05/19/2014   ED (erectile dysfunction) of organic origin 05/19/2014   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   Hernia, inguinal 05/19/2014   Hematuria, microscopic 05/19/2014   Prostatitis 05/19/2014   Calculus of kidney  05/19/2014   Diarrhea 01/17/2014   Excessive urination at night 07/01/2012   Must strain to pass urine 07/01/2012   Urgency of micturation 07/01/2012   FOM (frequency of micturition) 07/01/2012    Past Surgical History:  Procedure Laterality Date   APPENDECTOMY  1980   CYSTOSCOPY  03/31/2014   EXTRACORPOREAL SHOCK WAVE LITHOTRIPSY Left 10/10/2015   Procedure: EXTRACORPOREAL SHOCK WAVE LITHOTRIPSY (ESWL);  Surgeon: Hildred Laser, MD;  Location: [facility] ORS;  Service: Urology;  Laterality: Left;   LITHOTRIPSY      Prior to Admission medications   Medication Sig Start Date End Date Taking? Authorizing Provider  albuterol (VENTOLIN HFA) 108 (90 Base) MCG/ACT inhaler Inhale 2 puffs into the lungs every 6 (six) hours as needed for wheezing or shortness of breath. 08/27/20  Yes Sherissa Tenenbaum, Delorise Royals, PA-C  brompheniramine-pseudoephedrine-DM 30-2-10 MG/5ML syrup Take 10 mLs by mouth 4 (four) times daily as needed. 08/27/20  Yes Faylynn Stamos, Delorise Royals, PA-C  ondansetron (ZOFRAN-ODT) 4 MG disintegrating tablet Take 1 tablet (4 mg total) by mouth every 8 (eight) hours as needed for nausea or vomiting. 08/27/20  Yes Tiphanie Vo, Delorise Royals, PA-C  predniSONE (DELTASONE) 50 MG tablet Take 1 tablet (50 mg total) by mouth daily with breakfast. 08/27/20  Yes Nitza Schmid, Delorise Royals, PA-C  alfuzosin (UROXATRAL) 10 MG 24 hr tablet Take 1 tablet (10 mg total) by mouth daily with breakfast. 11/02/19   Vaillancourt, Lelon Mast, PA-C  cyclobenzaprine (FLEXERIL) 5 MG tablet Take 5 mg by mouth at bedtime as needed. 01/29/20   [provider]  esomeprazole (NEXIUM) 40 MG capsule Take 40 mg  by mouth daily. 02/21/20   [provider]  gabapentin (NEURONTIN) 100 MG capsule Take 1 capsule (100 mg total) by mouth 3 (three) times daily. 05/14/20   Felecia Shelling, DPM  mirabegron ER (MYRBETRIQ) 25 MG TB24 tablet Take 1 tablet (25 mg total) by mouth daily. 11/14/19   Vaillancourt, Lelon Mast, PA-C  naproxen (NAPROSYN)  500 MG tablet Take 1 tablet (500 mg total) by mouth 2 (two) times daily. 04/26/20   Felecia Shelling, DPM  omeprazole (PRILOSEC) 40 MG capsule Take 40 mg by mouth daily.    [provider]  propranolol (INDERAL) 20 MG tablet Take 1 tablet by mouth 2 (two) times daily. 04/01/20 04/01/21  [provider]  sucralfate (CARAFATE) 1 g tablet Take by mouth daily.  10/11/19   [provider]  tadalafil (CIALIS) 5 MG tablet Take 5 mg by mouth daily. 12/15/19   [provider]  temazepam (RESTORIL) 15 MG capsule Take 15 mg by mouth at bedtime. 07/21/15   [provider]  traMADol (ULTRAM) 50 MG tablet Take 1 tablet (50 mg total) by mouth every 8 (eight) hours as needed. 04/26/20   Felecia Shelling, DPM    Allergies Nortriptyline and Flomax [tamsulosin hcl]  Family History  Problem Relation Age of Onset   Bladder Cancer Neg Hx    Kidney cancer Neg Hx    Prostate cancer Neg Hx     Social History Social History   Tobacco Use   Smoking status: Never   Smokeless tobacco: Never  Vaping Use   Vaping Use: Never used  Substance Use Topics   Alcohol use: No    Alcohol/week: 0.0 standard drinks   Drug use: No     Review of Systems  Constitutional: Positive fever/chills.  Positive for body aches Eyes: No visual changes. No discharge ENT: Positive for nasal congestion and sore throat Cardiovascular: no chest pain. Respiratory: no cough. No SOB. Gastrointestinal: No abdominal pain.  Positive nausea, no vomiting.  No diarrhea.  No constipation. Genitourinary: Negative for dysuria. No hematuria Musculoskeletal: Negative for musculoskeletal pain. Skin: Negative for rash, abrasions, lacerations, ecchymosis. Neurological: Negative for headaches, focal weakness or numbness.  10 System ROS otherwise negative.  ____________________________________________   PHYSICAL EXAM:  VITAL SIGNS: ED Triage Vitals  Enc Vitals Group     BP 08/27/20 1440 (!) 162/95      Pulse Rate 08/27/20 1440 (!) 131     Resp 08/27/20 1440 (!) 24     Temp 08/27/20 1440 (!) 100.8 F (38.2 C)     Temp Source 08/27/20 1440 Oral     SpO2 08/27/20 1440 96 %     Weight 08/27/20 1459 172 lb (78 kg)     Height 08/27/20 1459 5\' 6"  (1.676 m)     Head Circumference --      Peak Flow --      Pain Score 08/27/20 1459 10     Pain Loc --      Pain Edu? --      Excl. in GC? --      Constitutional: Alert and oriented. Well appearing and in no acute distress. Eyes: Conjunctivae are normal. PERRL. EOMI. Head: Atraumatic. ENT:      Ears:       Nose: Moderate clear congestion/rhinnorhea.      Mouth/Throat: Mucous membranes are moist.  Oropharynx is nonerythematous and nonedematous.  Uvula is midline. Neck: No stridor.  Neck is supple [place] motion Hematological/Lymphatic/Immunilogical: No cervical lymphadenopathy. Cardiovascular:  Normal rate, regular rhythm. Normal S1 and S2.  Good peripheral circulation. Respiratory: Normal respiratory effort without tachypnea or retractions. Lungs CTAB. Good air entry to the bases with no decreased or absent breath sounds. Gastrointestinal: Bowel sounds 4 quadrants. Soft and nontender to palpation. No guarding or rigidity. No palpable masses. No distention. No CVA tenderness. Musculoskeletal: Full range of motion to all extremities. No gross deformities appreciated. Neurologic:  Normal speech and language. No gross focal neurologic deficits are appreciated.  Skin:  Skin is warm, dry and intact. No rash noted. Psychiatric: Mood and affect are normal. Speech and behavior are normal. Patient exhibits appropriate insight and judgement.   ____________________________________________   LABS (all labs ordered are listed, but only abnormal results are displayed)  Labs Reviewed  COMPREHENSIVE METABOLIC PANEL - Abnormal; Notable for the following components:      Result Value   Potassium 3.3 (*)    Glucose, Bld 119 (*)    Calcium 8.5 (*)    All  other components within normal limits  URINALYSIS, COMPLETE (UACMP) WITH MICROSCOPIC - Abnormal; Notable for the following components:   Color, Urine YELLOW (*)    APPearance CLEAR (*)    All other components within normal limits  RESP PANEL BY RT-PCR (FLU A&B, COVID) ARPGX2  CBC WITH DIFFERENTIAL/PLATELET  LACTIC ACID, PLASMA  LACTIC ACID, PLASMA   ____________________________________________  EKG   ____________________________________________  RADIOLOGY I personally viewed and evaluated these images as part of my medical decision making, as well as reviewing the written report by the radiologist.  ED Provider Interpretation: No acute consolidation concerning for pneumonia on chest x-ray.  Imaging of the head is reassuring at this time.  DG Chest 2 View  Result Date: 08/27/2020 CLINICAL DATA:  Cough and fever with shortness of breath EXAM: CHEST - 2 VIEW COMPARISON:  09/14/2018, CT from 04/01/2020 FINDINGS: Cardiac shadow is enlarged in size. Lungs are well aerated bilaterally. Minimal left basilar atelectasis is noted. No sizable effusion is seen. No bony abnormality is noted. IMPRESSION: Minimal left basilar atelectasis. Electronically Signed   By: Alcide CleverMark  Lukens M.D.   On: 08/27/2020 18:10   CT Head Wo Contrast  Result Date: 08/27/2020 CLINICAL DATA:  Headache EXAM: CT HEAD WITHOUT CONTRAST TECHNIQUE: Contiguous axial images were obtained from the base of the skull through the vertex without intravenous contrast. COMPARISON:  12/25/2018 FINDINGS: Brain: There is no acute intracranial hemorrhage, mass effect, or edema. Gray-white differentiation is preserved. There is no extra-axial fluid collection. Ventricles and sulci are within normal limits in size and configuration. Punctate left caudate calcification has been present since 2015. Vascular: No hyperdense vessel or unexpected calcification. Skull: Calvarium is unremarkable. Sinuses/Orbits: No acute finding. Other: None. IMPRESSION:  No acute abnormality. Electronically Signed   By: Guadlupe SpanishPraneil  Patel M.D.   On: 08/27/2020 17:57    ____________________________________________    PROCEDURES  Procedure(s) performed:    Procedures    Medications  acetaminophen (TYLENOL) tablet 650 mg (650 mg Oral Given 08/27/20 1503)  sodium chloride 0.9 % bolus 1,000 mL (0 mLs Intravenous Stopped 08/27/20 2116)  ibuprofen (ADVIL) tablet 600 mg (600 mg Oral Given 08/27/20 1735)     ____________________________________________   INITIAL IMPRESSION / ASSESSMENT AND PLAN / ED COURSE  Pertinent labs & imaging results that were available during my care of the patient were reviewed by me and considered in my medical decision making (see chart for details).  Review of the [place] CSRS was performed in accordance of the [facility]  prior to dispensing any controlled drugs.           Patient's diagnosis is consistent with viral illness.  Patient presents presents to the emergency department febrile, tachycardic with headache and multiple symptoms including body ache.  Patient had reassuring results at this time.  COVID and influenza was negative though I do feel a high suspicion that the patient likely does have either COVID or influenza.  Remainder of work-up is reassuring at this time.  No evidence of pneumonia on chest x-ray.  Labs are otherwise reassuring.  At this time I feel that patient will be stable for discharge as he is improved symptomatically with medications here.  He is able to eat and drink as he has done both in the emergency department.  This time I will prescribe prednisone, albuterol, Bromfed cough syrup and Zofran for symptom control.  Return precautions discussed with the patient and his wife..  Follow-up primary care as needed.  Patient is given ED precautions to return to the ED for any worsening or new symptoms.     ____________________________________________  FINAL CLINICAL IMPRESSION(S) / ED DIAGNOSES  Final diagnoses:   Viral illness      NEW MEDICATIONS STARTED DURING THIS VISIT:  ED Discharge Orders          Ordered    ondansetron (ZOFRAN-ODT) 4 MG disintegrating tablet  Every 8 hours PRN        08/27/20 2116    predniSONE (DELTASONE) 50 MG tablet  Daily with breakfast        08/27/20 2116    albuterol (VENTOLIN HFA) 108 (90 Base) MCG/ACT inhaler  Every 6 hours PRN        08/27/20 2116    brompheniramine-pseudoephedrine-DM 30-2-10 MG/5ML syrup  4 times daily PRN        08/27/20 2116                This chart was dictated using voice recognition software/Dragon. Despite best efforts to proofread, errors can occur which can change the meaning. Any change was purely unintentional.    Racheal Patches, PA-C 08/27/20 2121    Shaune Pollack, MD 08/29/20 [phone]

## 2020-08-31 ENCOUNTER — Emergency Department: Payer: 59

## 2020-08-31 ENCOUNTER — Emergency Department
Admission: EM | Admit: 2020-08-31 | Discharge: 2020-08-31 | Disposition: A | Discharge status: Home / Self Care | Payer: 59 | Attending: Emergency Medicine | Admitting: Emergency Medicine

## 2020-08-31 ENCOUNTER — Other Ambulatory Visit: Payer: Self-pay

## 2020-08-31 DIAGNOSIS — U071 COVID-19: Secondary | ICD-10-CM

## 2020-08-31 DIAGNOSIS — R0789 Other chest pain: Secondary | ICD-10-CM | POA: Insufficient documentation

## 2020-08-31 DIAGNOSIS — I10 Essential (primary) hypertension: Secondary | ICD-10-CM | POA: Diagnosis not present

## 2020-08-31 DIAGNOSIS — R6883 Chills (without fever): Secondary | ICD-10-CM | POA: Diagnosis present

## 2020-08-31 DIAGNOSIS — Z79899 Other long term (current) drug therapy: Secondary | ICD-10-CM | POA: Insufficient documentation

## 2020-08-31 LAB — CBC
HCT: 39.9 % (ref 39.0–52.0)
Hemoglobin: 13.6 g/dL (ref 13.0–17.0)
MCH: 27.5 pg (ref 26.0–34.0)
MCHC: 34.1 g/dL (ref 30.0–36.0)
MCV: 80.6 fL (ref 80.0–100.0)
Platelets: 211 10*3/uL (ref 150–400)
RBC: 4.95 MIL/uL (ref 4.22–5.81)
RDW: 13.5 % (ref 11.5–15.5)
WBC: 7.6 10*3/uL (ref 4.0–10.5)
nRBC: 0 % (ref 0.0–0.2)

## 2020-08-31 LAB — BASIC METABOLIC PANEL
Anion gap: 7 (ref 5–15)
BUN: 17 mg/dL (ref 6–20)
CO2: 27 mmol/L (ref 22–32)
Calcium: 8.4 mg/dL — ABNORMAL LOW (ref 8.9–10.3)
Chloride: 111 mmol/L (ref 98–111)
Creatinine, Ser: 0.67 mg/dL (ref 0.61–1.24)
GFR, Estimated: >60 mL/min (ref >60)
Glucose, Bld: 135 mg/dL — ABNORMAL HIGH (ref 70–99)
Potassium: 3 mmol/L — ABNORMAL LOW (ref 3.5–5.1)
Sodium: 145 mmol/L (ref 135–145)

## 2020-08-31 LAB — TROPONIN I (HIGH SENSITIVITY)
Troponin I (High Sensitivity): 4 ng/L (ref <18)
Troponin I (High Sensitivity): 4 ng/L (ref <18)

## 2020-08-31 MED ORDER — GUAIFENESIN ER 600 MG PO TB12: 600.0000 mg | ORAL_TABLET | Freq: Two times a day (BID) | ORAL | 0 refills | Status: AC

## 2020-08-31 MED ORDER — POTASSIUM CHLORIDE CRYS ER 20 MEQ PO TBCR
40.0000 meq | EXTENDED_RELEASE_TABLET | Freq: Once | ORAL | Status: AC
  Administered 2020-08-31: 40 meq via ORAL
  Filled 2020-08-31: qty 2

## 2020-08-31 MED ORDER — SODIUM CHLORIDE 0.9 % IV BOLUS
1000.0000 mL | Freq: Once | INTRAVENOUS | Status: AC
  Administered 2020-08-31: 1000 mL via INTRAVENOUS

## 2020-08-31 MED ORDER — IOHEXOL 350 MG/ML SOLN
75.0000 mL | Freq: Once | INTRAVENOUS | Status: AC | PRN
  Administered 2020-08-31: 75 mL via INTRAVENOUS

## 2020-08-31 MED ORDER — BENZONATATE 100 MG PO CAPS: 100.0000 mg | ORAL_CAPSULE | Freq: Three times a day (TID) | ORAL | 0 refills | Status: AC | PRN

## 2020-08-31 MED ORDER — PREDNISONE 10 MG PO TABS: 10.0000 mg | ORAL_TABLET | ORAL | 0 refills | Status: AC

## 2020-08-31 NOTE — ED Triage Notes (Signed)
Pt to ED for generalized cp that started last night. Seen recently for same. Denies shob, N/V Pt in NAD. RR even and unlabored.   Pt states he "sweats when cold"  In person interpreter Rafeal used

## 2020-08-31 NOTE — ED Notes (Signed)
To CT via stretcher

## 2020-08-31 NOTE — ED Provider Notes (Signed)
[facility] Emergency Department Provider Note  ____________________________________________  Time seen: Approximately 5:54 PM  I have reviewed the triage vital signs and the nursing notes.   HISTORY  Chief Complaint Chest Pain    HPI Allen Chapman is a 60 y.o. male who presents the emergency department complaining of ongoing symptoms of URI.  Patient is still having chills, aches, coughing.  He is here due to ongoing chest pressure as well.  He states that it is a tightness on the front side of his chest.  No difficulty breathing.  Patient is concerned because it has been 3 days of symptoms and it is not fully resolved.  I saw the patient at initial diagnosis at that time he had a negative COVID test but based off of the symptoms I feel that he does have COVID-19.  Patient has here for ongoing symptoms and complaining of chest tightness.  He is taking the prescribed medications which included steroid, albuterol, Bromfed cough syrup and Zofran.  Medical history as described below.       Past Medical History:  Diagnosis Date   Erectile dysfunction 07/02/2014   GERD (gastroesophageal reflux disease)    Hematuria, microscopic 07/02/2014   Hypogonadism in male 07/02/2014   Inguinal hernia, left 07/02/2014   Kidney stone    Prostate enlargement     Patient Active Problem List   Diagnosis Date Noted   Adductor tendinitis 01/23/2019   HTN (hypertension) 09/10/2015   Plantar fasciitis of left foot 09/10/2015   Chest wall pain 04/03/2015   Gastritis and gastroduodenitis 04/03/2015   Insomnia 04/03/2015   Snoring 04/03/2015   Chronic insomnia 10/31/2014   Elevated BP 10/31/2014   Gastroesophageal reflux disease without esophagitis 10/31/2014   Erectile dysfunction 07/02/2014   Hypogonadism in male 07/02/2014   Difficult or painful urination 05/19/2014   Enlarged prostate 05/19/2014   ED (erectile dysfunction) of organic origin 05/19/2014   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   Hernia, inguinal 05/19/2014   Hematuria, microscopic 05/19/2014   Prostatitis 05/19/2014   Calculus of kidney 05/19/2014   Diarrhea 01/17/2014   Excessive urination at night 07/01/2012   Must strain to pass urine 07/01/2012   Urgency of micturation 07/01/2012   FOM (frequency of micturition) 07/01/2012    Past Surgical History:  Procedure Laterality Date   APPENDECTOMY  1980   CYSTOSCOPY  03/31/2014   EXTRACORPOREAL SHOCK WAVE LITHOTRIPSY Left 10/10/2015   Procedure: EXTRACORPOREAL SHOCK WAVE LITHOTRIPSY (ESWL);  Surgeon: Hildred Laser, MD;  Location: [facility] ORS;  Service: Urology;  Laterality: Left;   LITHOTRIPSY      Prior to Admission medications   Medication Sig Start Date End Date Taking? Authorizing Provider  benzonatate (TESSALON PERLES) 100 MG capsule Take 1 capsule (100 mg total) by mouth 3 (three) times daily as needed for cough. 08/31/20 08/31/21 Yes Taler Kushner, Delorise Royals, PA-C  guaiFENesin (MUCINEX) 600 MG 12 hr tablet Take 1 tablet (600 mg total) by mouth 2 (two) times daily. 08/31/20 08/31/21 Yes Azzie Thiem, Delorise Royals, PA-C  predniSONE (DELTASONE) 10 MG tablet Take 1 tablet (10 mg total) by mouth as directed. 08/31/20  Yes Charlcie Prisco, Delorise Royals, PA-C  albuterol (VENTOLIN HFA) 108 (90 Base) MCG/ACT inhaler Inhale 2 puffs into the lungs every 6 (six) hours as needed for wheezing or shortness of breath. 08/27/20   Carrin Vannostrand, Delorise Royals, PA-C  alfuzosin (UROXATRAL) 10 MG 24 hr tablet Take 1 tablet (10 mg total) by mouth daily with breakfast. 11/02/19   Carman Ching, PA-C  brompheniramine-pseudoephedrine-DM 30-2-10 MG/5ML syrup Take 10 mLs by mouth 4 (four) times daily as needed. 08/27/20   Massai Hankerson, Delorise RoyalsJonathan D, PA-C  cyclobenzaprine (FLEXERIL) 5 MG tablet Take 5 mg by mouth at bedtime as needed. 01/29/20   [provider]  esomeprazole (NEXIUM) 40 MG capsule Take 40 mg by mouth daily. 02/21/20   [provider]  gabapentin (NEURONTIN) 100 MG  capsule Take 1 capsule (100 mg total) by mouth 3 (three) times daily. 05/14/20   Felecia ShellingEvans, Brent M, DPM  mirabegron ER (MYRBETRIQ) 25 MG TB24 tablet Take 1 tablet (25 mg total) by mouth daily. 11/14/19   Vaillancourt, Lelon MastSamantha, PA-C  naproxen (NAPROSYN) 500 MG tablet Take 1 tablet (500 mg total) by mouth 2 (two) times daily. 04/26/20   Felecia ShellingEvans, Brent M, DPM  omeprazole (PRILOSEC) 40 MG capsule Take 40 mg by mouth daily.    [provider]  ondansetron (ZOFRAN-ODT) 4 MG disintegrating tablet Take 1 tablet (4 mg total) by mouth every 8 (eight) hours as needed for nausea or vomiting. 08/27/20   Jahnya Trindade, Delorise RoyalsJonathan D, PA-C  propranolol (INDERAL) 20 MG tablet Take 1 tablet by mouth 2 (two) times daily. 04/01/20 04/01/21  [provider]  sucralfate (CARAFATE) 1 g tablet Take by mouth daily.  10/11/19   [provider]  tadalafil (CIALIS) 5 MG tablet Take 5 mg by mouth daily. 12/15/19   [provider]  temazepam (RESTORIL) 15 MG capsule Take 15 mg by mouth at bedtime. 07/21/15   [provider]  traMADol (ULTRAM) 50 MG tablet Take 1 tablet (50 mg total) by mouth every 8 (eight) hours as needed. 04/26/20   Felecia ShellingEvans, Brent M, DPM    Allergies Nortriptyline and Flomax [tamsulosin hcl]  Family History  Problem Relation Age of Onset   Bladder Cancer Neg Hx    Kidney cancer Neg Hx    Prostate cancer Neg Hx     Social History Social History   Tobacco Use   Smoking status: Never   Smokeless tobacco: Never  Vaping Use   Vaping Use: Never used  Substance Use Topics   Alcohol use: No    Alcohol/week: 0.0 standard drinks   Drug use: No     Review of Systems  Constitutional: Positive fever/chills.  Positive for body aches Eyes: No visual changes. No discharge ENT: No upper respiratory complaints. Cardiovascular: No substernal pain but endorses chest tightness Respiratory: Positive cough. No SOB. Gastrointestinal: No abdominal pain.  No nausea, no vomiting.  No  diarrhea.  No constipation. Musculoskeletal: Negative for musculoskeletal pain. Skin: Negative for rash, abrasions, lacerations, ecchymosis. Neurological: Negative for headaches, focal weakness or numbness.  10 System ROS otherwise negative.  ____________________________________________   PHYSICAL EXAM:  VITAL SIGNS: ED Triage Vitals  Enc Vitals Group     BP 08/31/20 1416 (!) 151/92     Pulse Rate 08/31/20 1416 99     Resp 08/31/20 1416 20     Temp 08/31/20 1416 98.6 F (37 C)     Temp Source 08/31/20 1416 Oral     SpO2 08/31/20 1416 99 %     Weight 08/31/20 1417 175 lb (79.4 kg)     Height 08/31/20 1417 5\' 6"  (1.676 m)     Head Circumference --      Peak Flow --      Pain Score 08/31/20 1417 7     Pain Loc --      Pain Edu? --      Excl. in GC? --  Constitutional: Alert and oriented. Well appearing and in no acute distress. Eyes: Conjunctivae are normal. PERRL. EOMI. Head: Atraumatic. ENT:      Ears:       Nose: No congestion/rhinnorhea.      Mouth/Throat: Mucous membranes are moist.  Neck: No stridor.    Cardiovascular: Normal rate, regular rhythm. Normal S1 and S2.  Good peripheral circulation. Respiratory: Normal respiratory effort without tachypnea or retractions. Lungs CTAB. Good air entry to the bases with no decreased or absent breath sounds. Musculoskeletal: Full range of motion to all extremities. No gross deformities appreciated. Neurologic:  Normal speech and language. No gross focal neurologic deficits are appreciated.  Skin:  Skin is warm, dry and intact. No rash noted. Psychiatric: Mood and affect are normal. Speech and behavior are normal. Patient exhibits appropriate insight and judgement.   ____________________________________________   LABS (all labs ordered are listed, but only abnormal results are displayed)  Labs Reviewed  BASIC METABOLIC PANEL - Abnormal; Notable for the following components:      Result Value   Potassium 3.0 (*)     Glucose, Bld 135 (*)    Calcium 8.4 (*)    All other components within normal limits  CBC  TROPONIN I (HIGH SENSITIVITY)  TROPONIN I (HIGH SENSITIVITY)   ____________________________________________  EKG   ____________________________________________  RADIOLOGY I personally viewed and evaluated these images as part of my medical decision making, as well as reviewing the written report by the radiologist.  ED Provider Interpretation: Chest x-ray revealed improving atelectasis with some bronchitic changes.  CT angio chest reveals no evidence of PE.  Some atelectasis is also identified on imaging.  No evidence of pneumonia.  DG Chest 2 View  Result Date: 08/31/2020 CLINICAL DATA:  Central chest pain since last night. EXAM: CHEST - 2 VIEW COMPARISON:  08/27/2020 FINDINGS: Normal sized heart. Significantly improved aeration at the lung bases with minimal linear density at both lung bases. Mild peribronchial thickening without significant change. Thoracic spine degenerative changes. IMPRESSION: 1. Improved bibasilar atelectasis. 2. Stable minimal chronic bronchitic changes. Electronically Signed   By: Beckie Salts M.D.   On: 08/31/2020 15:03   CT Angio Chest PE W and/or Wo Contrast  Result Date: 08/31/2020 CLINICAL DATA:  Generalized chest pain. EXAM: CT ANGIOGRAPHY CHEST WITH CONTRAST TECHNIQUE: Multidetector CT imaging of the chest was performed using the standard protocol during bolus administration of intravenous contrast. Multiplanar CT image reconstructions and MIPs were obtained to evaluate the vascular anatomy. CONTRAST:  34mL OMNIPAQUE IOHEXOL 350 MG/ML SOLN COMPARISON:  April 01, 2020 FINDINGS: Cardiovascular: Satisfactory opacification of the pulmonary arteries to the segmental level. No evidence of pulmonary embolism. Normal heart size. No pericardial effusion. Mediastinum/Nodes: No enlarged mediastinal, hilar, or axillary lymph nodes. Thyroid gland, trachea, and esophagus  demonstrate no significant findings. Lungs/Pleura: Bilateral lung base atelectasis. Lungs otherwise clear. Upper Abdomen: No acute abnormality. Partially visualized right renal cyst. Musculoskeletal: No chest wall abnormality. No acute or significant osseous findings. Review of the MIP images confirms the above findings. IMPRESSION: 1. No evidence of pulmonary embolism. 2. Bilateral lung base atelectasis. Electronically Signed   By: Ted Mcalpine M.D.   On: 08/31/2020 17:35    ____________________________________________    PROCEDURES  Procedure(s) performed:    Procedures    Medications  sodium chloride 0.9 % bolus 1,000 mL (1,000 mLs Intravenous New Bag/Given 08/31/20 1634)  iohexol (OMNIPAQUE) 350 MG/ML injection 75 mL (75 mLs Intravenous Contrast Given 08/31/20 1705)  potassium chloride SA (KLOR-CON)  CR tablet 40 mEq (40 mEq Oral Given 08/31/20 1757)     ____________________________________________   INITIAL IMPRESSION / ASSESSMENT AND PLAN / ED COURSE  Pertinent labs & imaging results that were available during my care of the patient were reviewed by me and considered in my medical decision making (see chart for details).  Review of the  [place]RS was performed in accordance of the [facility] prior to dispensing any controlled drugs.           Patient's diagnosis is consistent with viral illness/COVID-19.  Patient presents the emergency department complaining of chest tightness.  Patient was seen 3 days ago by myself and diagnosed with likely COVID-19.  Patient had a negative swab but based off of the symptoms felt it was most likely COVID-19 versus influenza.  Patient is still symptomatic and returns to the emergency department complaining of increased chest tightness.  With likely COVID diagnosis, some tachycardia and chest tightness on arrival differential included ongoing viral symptoms, pneumonia, PE.  Imaging revealed no evidence of PE.  Labs are otherwise reassuring.  Take  symptom control medications at home.  Let viral symptoms run its course..  Patient is given ED precautions to return to the ED for any worsening or new symptoms.     ____________________________________________  FINAL CLINICAL IMPRESSION(S) / ED DIAGNOSES  Final diagnoses:  COVID-19  Chest tightness      NEW MEDICATIONS STARTED DURING THIS VISIT:  ED Discharge Orders          Ordered    benzonatate (TESSALON PERLES) 100 MG capsule  3 times daily PRN        08/31/20 1807    guaiFENesin (MUCINEX) 600 MG 12 hr tablet  2 times daily        08/31/20 1807    predniSONE (DELTASONE) 10 MG tablet  As directed       Note to Pharmacy: Take on a daily basis of 6, 5, 4, 3, 2, 1   08/31/20 1822                This chart was dictated using voice recognition software/Dragon. Despite best efforts to proofread, errors can occur which can change the meaning. Any change was purely unintentional.    Racheal Patches, PA-C 08/31/20 Orbie Hurst, MD 08/31/20 2221

## 2020-08-31 NOTE — ED Notes (Signed)
In person translator used to review discharge instructions. Patient denies any questions or needs. Ambulatory to lobby without difficulty.

## 2020-10-09 ENCOUNTER — Other Ambulatory Visit: Payer: Self-pay | Admitting: Physician Assistant

## 2020-10-09 ENCOUNTER — Other Ambulatory Visit: Payer: Self-pay

## 2020-10-09 ENCOUNTER — Ambulatory Visit
Admission: RE | Admit: 2020-10-09 | Discharge: 2020-10-09 | Disposition: A | Discharge status: Home / Self Care | Payer: 59 | Source: Ambulatory Visit | Attending: Physician Assistant | Admitting: Physician Assistant

## 2020-10-09 DIAGNOSIS — M25561 Pain in right knee: Secondary | ICD-10-CM

## 2020-10-11 ENCOUNTER — Other Ambulatory Visit: Payer: Self-pay

## 2020-10-11 ENCOUNTER — Encounter: Payer: Self-pay | Admitting: Podiatry

## 2020-10-11 ENCOUNTER — Ambulatory Visit (INDEPENDENT_AMBULATORY_CARE_PROVIDER_SITE_OTHER): Payer: 59

## 2020-10-11 ENCOUNTER — Other Ambulatory Visit: Payer: Self-pay | Admitting: Podiatry

## 2020-10-11 ENCOUNTER — Ambulatory Visit: Payer: 59 | Admitting: Podiatry

## 2020-10-11 DIAGNOSIS — M76821 Posterior tibial tendinitis, right leg: Secondary | ICD-10-CM

## 2020-10-11 DIAGNOSIS — M7672 Peroneal tendinitis, left leg: Secondary | ICD-10-CM

## 2020-10-11 MED ORDER — BETAMETHASONE SOD PHOS & ACET 6 (3-3) MG/ML IJ SUSP
3.0000 mg | Freq: Once | INTRAMUSCULAR | Status: AC
Start: 2020-10-11 — End: 2020-10-11
  Administered 2020-10-11: 3 mg via INTRA_ARTICULAR

## 2020-10-11 MED ORDER — DICLOFENAC SODIUM 75 MG PO TBEC: 75.0000 mg | DELAYED_RELEASE_TABLET | Freq: Two times a day (BID) | ORAL | 1 refills | Status: AC

## 2020-10-11 NOTE — Progress Notes (Signed)
    HPI: 60 y.o. male presenting today with a new complaint regarding right foot pain to the inside of the right foot.  Present for few months now.  He works 10-hour shifts at the [employer].  Patient states he has significant pain and tenderness.  He denies a history of injury.  He has been taking meloxicam and Aleve with no relief.  He also says the tramadol does not help.  He presents for further treatment and evaluation  Past Medical History:  Diagnosis Date   Erectile dysfunction 07/02/2014   GERD (gastroesophageal reflux disease)    Hematuria, microscopic 07/02/2014   Hypogonadism in male 07/02/2014   Inguinal hernia, left 07/02/2014   Kidney stone    Prostate enlargement      Physical Exam: General: The patient is alert and oriented x3 in no acute distress.  Dermatology: Skin is warm, dry and supple bilateral lower extremities. Negative for open lesions or macerations.  Vascular: Palpable pedal pulses bilaterally. No edema or erythema noted. Capillary refill within normal limits.  Neurological: Epicritic and protective threshold grossly intact bilaterally.   Musculoskeletal Exam: Pain on palpation noted along posterior tibial tendon of the right lower extremity. Range of motion within normal limits. Muscle strength 5/5 in all muscle groups bilateral lower extremities.  Radiographic Exam:  Normal osseous mineralization. Joint spaces preserved. No fracture or dislocation identified.    Assessment: 1. Posterior tibial tendinitis right   Plan of Care:  1. Patient was evaluated. Radiographs were reviewed today. 2. Injection of 0.5 mL Celestone Soluspan injected into the posterior tibial tendon sheath.  3.  Prescription for diclofenac 75 mg 2 times daily.  Discontinue all other NSAIDs 4.  Patient declined a prednisone pack.  He says it keeps him up at night 5.  Recommend good supportive shoes with arch supports 6.  Return to clinic as needed   Felecia Shelling, DPM [facility]  Dr. Felecia Shelling, DPM    [address] [place], [place] [address]                Office [phone]  Fax [phone]

## 2020-11-22 ENCOUNTER — Ambulatory Visit: Payer: 59 | Admitting: Podiatry

## 2021-02-25 ENCOUNTER — Other Ambulatory Visit: Payer: Self-pay | Admitting: Family Medicine

## 2021-02-25 DIAGNOSIS — R519 Headache, unspecified: Secondary | ICD-10-CM

## 2021-03-01 ENCOUNTER — Other Ambulatory Visit: Payer: Self-pay

## 2021-03-01 ENCOUNTER — Encounter: Payer: Self-pay | Admitting: Emergency Medicine

## 2021-03-01 DIAGNOSIS — I10 Essential (primary) hypertension: Secondary | ICD-10-CM | POA: Diagnosis not present

## 2021-03-01 DIAGNOSIS — K644 Residual hemorrhoidal skin tags: Secondary | ICD-10-CM | POA: Diagnosis present

## 2021-03-01 DIAGNOSIS — Z79899 Other long term (current) drug therapy: Secondary | ICD-10-CM | POA: Insufficient documentation

## 2021-03-01 NOTE — ED Triage Notes (Signed)
Pt c/o an abscess on his rectum that started this AM. Pt states that the abscess has increased in size and c/o pain.

## 2021-03-02 ENCOUNTER — Other Ambulatory Visit: Payer: Self-pay

## 2021-03-02 ENCOUNTER — Emergency Department
Admission: EM | Admit: 2021-03-02 | Discharge: 2021-03-02 | Disposition: A | Discharge status: Home / Self Care | Payer: 59 | Attending: Emergency Medicine | Admitting: Emergency Medicine

## 2021-03-02 ENCOUNTER — Emergency Department
Admission: EM | Admit: 2021-03-02 | Discharge: 2021-03-02 | Disposition: A | Discharge status: Home / Self Care | Payer: 59 | Source: Home / Self Care

## 2021-03-02 DIAGNOSIS — I10 Essential (primary) hypertension: Secondary | ICD-10-CM

## 2021-03-02 DIAGNOSIS — K649 Unspecified hemorrhoids: Secondary | ICD-10-CM | POA: Insufficient documentation

## 2021-03-02 DIAGNOSIS — K644 Residual hemorrhoidal skin tags: Secondary | ICD-10-CM

## 2021-03-02 DIAGNOSIS — Z79899 Other long term (current) drug therapy: Secondary | ICD-10-CM | POA: Insufficient documentation

## 2021-03-02 LAB — COMPREHENSIVE METABOLIC PANEL
ALT: 19 U/L (ref 0–44)
AST: 20 U/L (ref 15–41)
Albumin: 3.7 g/dL (ref 3.5–5.0)
Alkaline Phosphatase: 65 U/L (ref 38–126)
Anion gap: 5 (ref 5–15)
BUN: 19 mg/dL (ref 8–23)
CO2: 25 mmol/L (ref 22–32)
Calcium: 9.3 mg/dL (ref 8.9–10.3)
Chloride: 109 mmol/L (ref 98–111)
Creatinine, Ser: 0.75 mg/dL (ref 0.61–1.24)
GFR, Estimated: >60 mL/min (ref >60)
Glucose, Bld: 114 mg/dL — ABNORMAL HIGH (ref 70–99)
Potassium: 4.1 mmol/L (ref 3.5–5.1)
Sodium: 139 mmol/L (ref 135–145)
Total Bilirubin: 0.8 mg/dL (ref 0.3–1.2)
Total Protein: 6.9 g/dL (ref 6.5–8.1)

## 2021-03-02 LAB — CBC WITH DIFFERENTIAL/PLATELET
Abs Immature Granulocytes: 0.01 10*3/uL (ref 0.00–0.07)
Basophils Absolute: 0 10*3/uL (ref 0.0–0.1)
Basophils Relative: 0 %
Eosinophils Absolute: 0.2 10*3/uL (ref 0.0–0.5)
Eosinophils Relative: 4 %
HCT: 43.1 % (ref 39.0–52.0)
Hemoglobin: 14.5 g/dL (ref 13.0–17.0)
Immature Granulocytes: 0 %
Lymphocytes Relative: 43 %
Lymphs Abs: 2.3 10*3/uL (ref 0.7–4.0)
MCH: 27.5 pg (ref 26.0–34.0)
MCHC: 33.6 g/dL (ref 30.0–36.0)
MCV: 81.8 fL (ref 80.0–100.0)
Monocytes Absolute: 0.4 10*3/uL (ref 0.1–1.0)
Monocytes Relative: 8 %
Neutro Abs: 2.4 10*3/uL (ref 1.7–7.7)
Neutrophils Relative %: 45 %
Platelets: 261 10*3/uL (ref 150–400)
RBC: 5.27 MIL/uL (ref 4.22–5.81)
RDW: 13.2 % (ref 11.5–15.5)
WBC: 5.3 10*3/uL (ref 4.0–10.5)
nRBC: 0 % (ref 0.0–0.2)

## 2021-03-02 MED ORDER — LIDOCAINE-EPINEPHRINE 2 %-1:100000 IJ SOLN
20.0000 mL | Freq: Once | INTRAMUSCULAR | Status: AC
  Administered 2021-03-02: 20 mL via INTRADERMAL
  Filled 2021-03-02: qty 1

## 2021-03-02 MED ORDER — OXYCODONE-ACETAMINOPHEN 5-325 MG PO TABS
1.0000 | ORAL_TABLET | Freq: Once | ORAL | Status: AC
Start: 2021-03-02 — End: 2021-03-02
  Administered 2021-03-02: 1 via ORAL
  Filled 2021-03-02: qty 1

## 2021-03-02 NOTE — ED Provider Notes (Signed)
[facility] EMERGENCY DEPARTMENT Provider Note   CSN: [id number] Arrival date & time: 03/02/21  1440     History  Chief Complaint  Patient presents with   Rectal Bleeding    Allen Chapman is a 61 y.o. male with a history of GERD, hypogonadism, kidney stone, prostate enlargement and hypertension presents today after having a thrombosed hemorrhoid I&D earlier this morning.  He presents because of drainage, he describes bloody drainage from the hemorrhoid.  He states his pain has improved since the thrombosed hemorrhoid procedure in the ER.  He denies any fevers.  He is ambulatory he has not needed any medications for pain.  HPI     Home Medications Prior to Admission medications   Medication Sig Start Date End Date Taking? Authorizing Provider  albuterol (VENTOLIN HFA) 108 (90 Base) MCG/ACT inhaler Inhale 2 puffs into the lungs every 6 (six) hours as needed for wheezing or shortness of breath. 08/27/20   Cuthriell, Delorise Royals, PA-C  alfuzosin (UROXATRAL) 10 MG 24 hr tablet Take 1 tablet (10 mg total) by mouth daily with breakfast. 11/02/19   Vaillancourt, Lelon Mast, PA-C  benzonatate (TESSALON PERLES) 100 MG capsule Take 1 capsule (100 mg total) by mouth 3 (three) times daily as needed for cough. 08/31/20 08/31/21  Cuthriell, Delorise Royals, PA-C  brompheniramine-pseudoephedrine-DM 30-2-10 MG/5ML syrup Take 10 mLs by mouth 4 (four) times daily as needed. 08/27/20   Cuthriell, Delorise Royals, PA-C  cyclobenzaprine (FLEXERIL) 5 MG tablet Take 5 mg by mouth at bedtime as needed. 01/29/20   [provider]  diclofenac (VOLTAREN) 75 MG EC tablet Take 1 tablet (75 mg total) by mouth 2 (two) times daily. 10/11/20   Felecia Shelling, DPM  esomeprazole (NEXIUM) 40 MG capsule Take 40 mg by mouth daily. 02/21/20   [provider]  gabapentin (NEURONTIN) 100 MG capsule Take 1 capsule (100 mg total) by mouth 3 (three) times daily. 05/14/20   Felecia Shelling, DPM  guaiFENesin  (MUCINEX) 600 MG 12 hr tablet Take 1 tablet (600 mg total) by mouth 2 (two) times daily. 08/31/20 08/31/21  Cuthriell, Delorise Royals, PA-C  meloxicam (MOBIC) 15 MG tablet Take 15 mg by mouth daily. 10/09/20   [provider]  mirabegron ER (MYRBETRIQ) 25 MG TB24 tablet Take 1 tablet (25 mg total) by mouth daily. 11/14/19   Vaillancourt, Lelon Mast, PA-C  naproxen (NAPROSYN) 500 MG tablet Take 1 tablet (500 mg total) by mouth 2 (two) times daily. 04/26/20   Felecia Shelling, DPM  omeprazole (PRILOSEC) 40 MG capsule Take 40 mg by mouth daily.    [provider]  ondansetron (ZOFRAN-ODT) 4 MG disintegrating tablet Take 1 tablet (4 mg total) by mouth every 8 (eight) hours as needed for nausea or vomiting. 08/27/20   Cuthriell, Delorise Royals, PA-C  predniSONE (DELTASONE) 10 MG tablet Take 1 tablet (10 mg total) by mouth as directed. 08/31/20   Cuthriell, Delorise Royals, PA-C  propranolol (INDERAL) 20 MG tablet Take 1 tablet by mouth 2 (two) times daily. 04/01/20 04/01/21  [provider]  sucralfate (CARAFATE) 1 g tablet Take by mouth daily.  10/11/19   [provider]  tadalafil (CIALIS) 5 MG tablet Take 5 mg by mouth daily. 12/15/19   [provider]  temazepam (RESTORIL) 15 MG capsule Take 15 mg by mouth at bedtime. 07/21/15   [provider]  traMADol (ULTRAM) 50 MG tablet Take 1 tablet (50 mg total) by mouth every 8 (eight) hours as needed.  04/26/20   Felecia Shelling, DPM      Allergies    Nortriptyline, Flomax [tamsulosin hcl], and Gabapentin    Review of Systems   Review of Systems  Physical Exam Updated Vital Signs BP (!) 183/90    Pulse 100    Temp 98 F (36.7 C) (Oral)    Resp 16    Ht 5\' 6"  (1.676 m)    SpO2 98%    BMI 27.76 kg/m  Physical Exam Constitutional:      Appearance: He is well-developed.  HENT:     Head: Normocephalic and atraumatic.  Eyes:     Conjunctiva/sclera: Conjunctivae normal.  Cardiovascular:     Rate and Rhythm: Normal rate.   Pulmonary:     Effort: Pulmonary effort is normal. No respiratory distress.  Genitourinary:    Comments: Rectal area shows some very small hemorrhoid that has been I&D, there is some small blood clots present that have been evacuated 1 small blood clot located within the hemorrhoid, this was easily expressed out.  No active bleeding noted.  There is no swelling or tenderness to palpation. Musculoskeletal:        General: Normal range of motion.     Cervical back: Normal range of motion.  Skin:    General: Skin is warm.     Findings: No rash.  Neurological:     General: No focal deficit present.     Mental Status: He is alert and oriented to person, place, and time.  Psychiatric:        Behavior: Behavior normal.        Thought Content: Thought content normal.    ED Results / Procedures / Treatments   Labs (all labs ordered are listed, but only abnormal results are displayed) Labs Reviewed - No data to display  EKG None  Radiology No results found.  Procedures Procedures    Medications Ordered in ED Medications - No data to display  ED Course/ Medical Decision Making/ A&P                           Medical Decision Making  61 year old male with thrombosed hemorrhoid procedure earlier today.  Notes reviewed from the ER earlier today.  Patient appears to be having normal postprocedure drainage, no pain.  No signs of any abscess or infectious process.  Vital signs are stable.  Pain well controlled.  Patient will continue with sitz bath's, he will expect drainage over the next couple of days.  If continued drainage or if severe increasing bleeding he will return. Final Clinical Impression(s) / ED Diagnoses Final diagnoses:  Bleeding hemorrhoid    Rx / DC Orders ED Discharge Orders     None         77 03/02/21 1616    03/04/21, MD 03/02/21 03/04/21

## 2021-03-02 NOTE — ED Triage Notes (Addendum)
Triage completed using spanish interpreter [phone]  Pt to ER with complaints of rectal abscess, states he was seen last night for same. Reports last night they drained the abscess, now the area is continuing to bleed. Pain has improved. Patient is concerned about amount of bleeding. Abscess had been present for 1 day prior to I&D.

## 2021-03-02 NOTE — Discharge Instructions (Addendum)
Please have your blood pressure rechecked in 7 to 10 days it was elevated today.  As we discussed please eat a high-fiber diet.  Return for any new or worsening of symptoms.

## 2021-03-02 NOTE — ED Provider Notes (Signed)
[facility] [place] Provider Note    Event Date/Time   First MD Initiated Contact with Patient 03/02/21 [phone]     (approximate)   History   Abscess   HPI  Allen Chapman is a 61 y.o. male with past medical history of GERD, hypogonadism, kidney stone, prostate enlargement and hypertension who presents for assessment of 1 day of enlarging painful mass in his rectum.  He denies any trauma.  He has not had any difficulty pooping and denies any abdominal pain, back pain, fevers,  cough, chest pain, headache, earache, sore throat or any other acute sick symptoms.  Denies any trauma to the area.  No prior similar episodes.  Has never had hemorrhoids or abscess in the area before.      Physical Exam  Triage Vital Signs: ED Triage Vitals  Enc Vitals Group     BP 03/01/21 2349 (!) 162/101     Pulse Rate 03/01/21 2349 88     Resp 03/01/21 2349 18     Temp 03/01/21 2349 98 F (36.7 C)     Temp src --      SpO2 03/01/21 2349 96 %     Weight 03/01/21 2353 172 lb (78 kg)     Height 03/01/21 2353 5\' 6"  (1.676 m)     Head Circumference --      Peak Flow --      Pain Score 03/01/21 2350 10     Pain Loc --      Pain Edu? --      Excl. in GC? --     Most recent vital signs: Vitals:   03/01/21 2349 03/02/21 0218  BP: (!) 162/101 (!) 138/91  Pulse: 88 90  Resp: 18 16  Temp: 98 F (36.7 C)   SpO2: 96%     General: Awake, appears uncomfortable. CV:  Good peripheral perfusion.  Resp:  Normal effort.  Abd:  No distention.  Soft throughout. Other:  Approximately 0.5 cm oval-shaped tender erythematous mass on the right side of the external anal canal.  No other lesions noted.   ED Results / Procedures / Treatments  Labs (all labs ordered are listed, but only abnormal results are displayed) Labs Reviewed  COMPREHENSIVE METABOLIC PANEL - Abnormal; Notable for the following components:      Result Value   Glucose, Bld 114 (*)    All other components within normal  limits  CBC WITH DIFFERENTIAL/PLATELET     EKG   RADIOLOGY   PROCEDURES:  Critical Care performed: No  ..Incision and Drainage  Date/Time: 03/02/2021 1:38 AM Performed by: 03/04/2021, MD Authorized by: Gilles Chiquito, MD   Consent:    Consent obtained:  Verbal   Consent given by:  Patient   Risks discussed:  Bleeding, infection, incomplete drainage, pain and damage to other organs   Alternatives discussed:  No treatment and observation Universal protocol:    Procedure explained and questions answered to patient or proxy's satisfaction: yes     Patient identity confirmed:  Verbally with patient Location:    Type:  External thrombosed hemorrhoid   Size:  0.5   Location:  Anogenital   Anogenital location:  Rectum Anesthesia:    Anesthesia method:  Local infiltration   Local anesthetic:  Lidocaine 2% WITH epi Procedure type:    Complexity:  Simple Procedure details:    Ultrasound guidance: no     Needle aspiration: no     Incision types:  Single  straight   Drainage:  Bloody (thrombosed clot)   Drainage amount:  Moderate   Wound treatment:  Wound left open   Packing materials:  None Post-procedure details:    Procedure completion:  Tolerated well, no immediate complications    MEDICATIONS ORDERED IN ED: Medications  oxyCODONE-acetaminophen (PERCOCET/ROXICET) 5-325 MG per tablet 1 tablet (1 tablet Oral Given 03/02/21 0121)  lidocaine-EPINEPHrine (XYLOCAINE W/EPI) 2 %-1:100000 (with pres) injection 20 mL (20 mLs Intradermal Given by Other 03/02/21 0141)     IMPRESSION / MDM / ASSESSMENT AND PLAN / ED COURSE  I reviewed the triage vital signs and the nursing notes.                              Differential diagnosis includes, but is not limited to thrombosed hemorrhoid versus abscess.  Given rapid onset of symptoms have a lower suspicion for condyloma.   I&D performed due to onset less than 72 hours and fairly severe pain.  Patient experienced immediate  pain relief following I&D.  Discussed using Tylenol ibuprofen and sitz bath as needed as well as high-fiber diet to prevent recurrence.  He will follow-up with PCP.  Low suspicion for infectious process given absence of any purulent fluid or any surrounding skin changes.  Discharged in stable condition.  Strict return precautions advised and discussed.  Also advised in writing to have blood pressure rechecked as it was elevated today.     FINAL CLINICAL IMPRESSION(S) / ED DIAGNOSES   Final diagnoses:  External hemorrhoid  Hypertension, unspecified type     Rx / DC Orders   ED Discharge Orders     None        Note:  This document was prepared using Dragon voice recognition software and may include unintentional dictation errors.   Gilles Chiquito, MD 03/02/21 [phone]

## 2021-03-02 NOTE — Discharge Instructions (Addendum)
Continue with high-fiber diet and drinking lots of fluids.  Follow-up with PCP.  Return to the ER for any increasing pain fevers severe bleeding worsening symptoms or urgent changes in health.

## 2021-03-14 ENCOUNTER — Ambulatory Visit: Payer: 59

## 2021-03-31 ENCOUNTER — Other Ambulatory Visit: Payer: Self-pay

## 2021-03-31 ENCOUNTER — Ambulatory Visit
Admission: RE | Admit: 2021-03-31 | Discharge: 2021-03-31 | Disposition: A | Discharge status: Home / Self Care | Payer: 59 | Source: Ambulatory Visit | Attending: Family Medicine | Admitting: Family Medicine

## 2021-03-31 DIAGNOSIS — R519 Headache, unspecified: Secondary | ICD-10-CM | POA: Diagnosis present

## 2021-04-15 ENCOUNTER — Encounter: Payer: Self-pay | Admitting: *Deleted

## 2021-04-22 ENCOUNTER — Ambulatory Visit: Payer: 59 | Admitting: Urology

## 2021-04-23 ENCOUNTER — Ambulatory Visit: Payer: 59 | Admitting: Urology

## 2021-07-24 ENCOUNTER — Encounter: Payer: Self-pay | Admitting: *Deleted

## 2021-08-22 ENCOUNTER — Ambulatory Visit: Admission: RE | Admit: 2021-08-22 | Payer: 59 | Source: Home / Self Care | Admitting: Gastroenterology

## 2021-08-22 ENCOUNTER — Encounter: Admission: RE | Payer: Self-pay | Source: Home / Self Care

## 2021-08-22 SURGERY — COLONOSCOPY
Anesthesia: General

## 2021-10-14 ENCOUNTER — Ambulatory Visit: Payer: Self-pay | Admitting: Urology

## 2022-12-14 ENCOUNTER — Other Ambulatory Visit: Payer: Self-pay | Admitting: Family Medicine

## 2022-12-14 DIAGNOSIS — R1013 Epigastric pain: Secondary | ICD-10-CM

## 2022-12-14 DIAGNOSIS — K219 Gastro-esophageal reflux disease without esophagitis: Secondary | ICD-10-CM

## 2022-12-16 ENCOUNTER — Ambulatory Visit
Admission: RE | Admit: 2022-12-16 | Discharge: 2022-12-16 | Disposition: A | Discharge status: Home / Self Care | Payer: 59 | Source: Ambulatory Visit | Attending: Family Medicine | Admitting: Family Medicine

## 2022-12-16 DIAGNOSIS — K219 Gastro-esophageal reflux disease without esophagitis: Secondary | ICD-10-CM | POA: Insufficient documentation

## 2022-12-16 DIAGNOSIS — R1013 Epigastric pain: Secondary | ICD-10-CM | POA: Diagnosis present

## 2023-02-04 ENCOUNTER — Other Ambulatory Visit: Payer: Self-pay | Admitting: Nurse Practitioner

## 2023-02-04 DIAGNOSIS — R1013 Epigastric pain: Secondary | ICD-10-CM

## 2023-02-24 ENCOUNTER — Ambulatory Visit: Admission: RE | Admit: 2023-02-24 | Payer: Self-pay | Source: Ambulatory Visit

## 2023-03-13 ENCOUNTER — Other Ambulatory Visit: Payer: Self-pay

## 2023-03-13 ENCOUNTER — Emergency Department
Admission: EM | Admit: 2023-03-13 | Discharge: 2023-03-13 | Disposition: A | Discharge status: Home / Self Care | Payer: Managed Care, Other (non HMO) | Attending: Emergency Medicine | Admitting: Emergency Medicine

## 2023-03-13 ENCOUNTER — Emergency Department: Payer: Managed Care, Other (non HMO)

## 2023-03-13 DIAGNOSIS — J101 Influenza due to other identified influenza virus with other respiratory manifestations: Secondary | ICD-10-CM | POA: Diagnosis not present

## 2023-03-13 DIAGNOSIS — Z1152 Encounter for screening for COVID-19: Secondary | ICD-10-CM | POA: Diagnosis not present

## 2023-03-13 DIAGNOSIS — J111 Influenza due to unidentified influenza virus with other respiratory manifestations: Secondary | ICD-10-CM

## 2023-03-13 DIAGNOSIS — M791 Myalgia, unspecified site: Secondary | ICD-10-CM | POA: Diagnosis present

## 2023-03-13 LAB — CBC WITH DIFFERENTIAL/PLATELET
Abs Immature Granulocytes: 0 10*3/uL (ref 0.00–0.07)
Basophils Absolute: 0 10*3/uL (ref 0.0–0.1)
Basophils Relative: 0 %
Eosinophils Absolute: 0.1 10*3/uL (ref 0.0–0.5)
Eosinophils Relative: 4 %
HCT: 43.1 % (ref 39.0–52.0)
Hemoglobin: 13.7 g/dL (ref 13.0–17.0)
Immature Granulocytes: 0 %
Lymphocytes Relative: 51 %
Lymphs Abs: 1.6 10*3/uL (ref 0.7–4.0)
MCH: 25.3 pg — ABNORMAL LOW (ref 26.0–34.0)
MCHC: 31.8 g/dL (ref 30.0–36.0)
MCV: 79.7 fL — ABNORMAL LOW (ref 80.0–100.0)
Monocytes Absolute: 0.2 10*3/uL (ref 0.1–1.0)
Monocytes Relative: 8 %
Neutro Abs: 1.2 10*3/uL — ABNORMAL LOW (ref 1.7–7.7)
Neutrophils Relative %: 37 %
Platelets: 182 10*3/uL (ref 150–400)
RBC: 5.41 MIL/uL (ref 4.22–5.81)
RDW: 14.8 % (ref 11.5–15.5)
WBC: 3.2 10*3/uL — ABNORMAL LOW (ref 4.0–10.5)
nRBC: 0 % (ref 0.0–0.2)

## 2023-03-13 LAB — RESP PANEL BY RT-PCR (RSV, FLU A&B, COVID)  RVPGX2
Influenza A by PCR: NEGATIVE
Influenza B by PCR: NEGATIVE
Resp Syncytial Virus by PCR: NEGATIVE
SARS Coronavirus 2 by RT PCR: NEGATIVE

## 2023-03-13 LAB — BASIC METABOLIC PANEL
Anion gap: 8 (ref 5–15)
BUN: 14 mg/dL (ref 8–23)
CO2: 26 mmol/L (ref 22–32)
Calcium: 8.6 mg/dL — ABNORMAL LOW (ref 8.9–10.3)
Chloride: 108 mmol/L (ref 98–111)
Creatinine, Ser: 0.79 mg/dL (ref 0.61–1.24)
GFR, Estimated: >60 mL/min (ref >60)
Glucose, Bld: 96 mg/dL (ref 70–99)
Potassium: 4.5 mmol/L (ref 3.5–5.1)
Sodium: 142 mmol/L (ref 135–145)

## 2023-03-13 MED ORDER — HYDROCODONE BIT-HOMATROP MBR 5-1.5 MG/5ML PO SOLN: 5.0000 mL | Freq: Four times a day (QID) | ORAL | 0 refills | Status: AC | PRN

## 2023-03-13 MED ORDER — DEXAMETHASONE 4 MG PO TABS
4.0000 mg | ORAL_TABLET | Freq: Once | ORAL | Status: AC
  Administered 2023-03-13: 4 mg via ORAL
  Filled 2023-03-13: qty 1

## 2023-03-13 NOTE — ED Triage Notes (Signed)
Pt to ED for congestion, cough, fever, body aches, weakness since several days ago. States had + influenza result on Wednesday at [facility] and has been taking Tamiflu but still feels bad. States is feeling worse and worse, especially today. Denies vomiting or diarrhea. States not sleeping well. VSS in triage. Sent Covid/flu swab. States that this morning he had blood-tinged sputum. Requesting that bloodwork be done because he feels weak. Spanish speaking.

## 2023-03-13 NOTE — Discharge Instructions (Addendum)
You were seen in the for your fever and bodyaches.  I suspect this is related to your known flu infection.  Take your medications as directed.  Return to the ER for new or worsening symptoms.

## 2023-03-13 NOTE — ED Provider Triage Note (Signed)
Emergency Medicine Provider Triage Evaluation Note  Allen Chapman , a 63 y.o. male  was evaluated in triage.  Pt complains of cough, body aches, blood tinged sputum since Wednesday. Dx with Flu on Wednesday, on tamiflu.  Review of Systems  Positive: Cough, body aches Negative: N/v/d  Physical Exam  BP (!) 139/99 (BP Location: Left Arm)   Pulse 64   Temp 98.8 F (37.1 C) (Oral)   Resp 16   Ht 5\' 6"  (1.676 m)   Wt 77.1 kg   SpO2 98%   BMI 27.44 kg/m  Gen:   Awake, no distress   Resp:  Normal effort  MSK:   Moves extremities without difficulty  Other:    Medical Decision Making  Medically screening exam initiated at 2:42 PM.  Appropriate orders placed.  Allen Chapman was informed that the remainder of the evaluation will be completed by another provider, this initial triage assessment does not replace that evaluation, and the importance of remaining in the ED until their evaluation is complete.     Jackelyn Hoehn, PA-C 03/13/23 1443

## 2023-03-13 NOTE — ED Provider Notes (Signed)
[facility] Provider Note    Event Date/Time   First MD Initiated Contact with Patient 03/13/23 1704     (approximate)   History   Generalized Body Aches   HPI  Allen Chapman is a 63 year old male senting to the emergency department for evaluation of flulike symptoms.  Symptoms started on Monday.  Reports body aches, fever, cough.  No nausea, vomiting, abdominal pain.  Was seen at [facility] on Wednesday where he was diagnosed with the flu.  He was placed on Tamiflu, but reports that he is not getting any better leading him to present to the ER.     Physical Exam   Triage Vital Signs: ED Triage Vitals  Encounter Vitals Group     BP 03/13/23 1428 (!) 139/99     Systolic BP Percentile --      Diastolic BP Percentile --      Pulse Rate 03/13/23 1428 64     Resp 03/13/23 1428 16     Temp 03/13/23 1428 98.8 F (37.1 C)     Temp Source 03/13/23 1428 Oral     SpO2 03/13/23 1428 98 %     Weight 03/13/23 1433 170 lb (77.1 kg)     Height 03/13/23 1433 5\' 6"  (1.676 m)     Head Circumference --      Peak Flow --      Pain Score 03/13/23 1433 8     Pain Loc --      Pain Education --      Exclude from Growth Chart --     Most recent vital signs: Vitals:   03/13/23 1428  BP: (!) 139/99  Pulse: 64  Resp: 16  Temp: 98.8 F (37.1 C)  SpO2: 98%     General: Awake, interactive  HEENT: Mild erythema of the posterior oropharynx, no uvular swelling or deviation, no tonsillar exudates, no swelling underneath the tongue CV:  Regular rate, good peripheral perfusion.  Resp:  Unlabored respirations, lungs clear to auscultation Abd:  Nondistended.  Neuro:  Symmetric facial movement, fluid speech   ED Results / Procedures / Treatments   Labs (all labs ordered are listed, but only abnormal results are displayed) Labs Reviewed  CBC WITH DIFFERENTIAL/PLATELET - Abnormal; Notable for the following components:      Result Value   WBC 3.2 (*)    MCV  79.7 (*)    MCH 25.3 (*)    Neutro Abs 1.2 (*)    All other components within normal limits  BASIC METABOLIC PANEL - Abnormal; Notable for the following components:   Calcium 8.6 (*)    All other components within normal limits  RESP PANEL BY RT-PCR (RSV, FLU A&B, COVID)  [id number]     EKG EKG independently reviewed interpreted by myself (ER attending) demonstrates:    RADIOLOGY Imaging independently reviewed and interpreted by myself demonstrates:  CXR without focal consolidation  PROCEDURES:  Critical Care performed: No  Procedures   MEDICATIONS ORDERED IN ED: Medications  dexamethasone (DECADRON) tablet 4 mg (4 mg Oral Given 03/13/23 1759)     IMPRESSION / MDM / ASSESSMENT AND PLAN / ED COURSE  I reviewed the triage vital signs and the nursing notes.  Differential diagnosis includes, but is not limited to, sequela from known influenza infection, superimposed pneumonia, anemia, electrolyte abnormality  Patient's presentation is most consistent with acute illness / injury with system symptoms.  63 year old male presenting with cough, fever, body aches in the setting  of known influenza infection.  Vital stable on presentation. Labs without critical derangement.  X-Dierks [place]ach without pneumonia.  Discussed that this is likely the natural course of patient's known influenza infection.  Patient airway inflammation, discussed that antibiotics are not indicated, but will trial a dose of Decadron.  Will DC with prescription for cough syrup.  Strict return precautions provided.  Patient discharged stable condition.    FINAL CLINICAL IMPRESSION(S) / ED DIAGNOSES   Final diagnoses:  Influenza     Rx / DC Orders   ED Discharge Orders          Ordered    HYDROcodone bit-homatropine (HYCODAN) 5-1.5 MG/5ML syrup  Every 6 hours PRN        03/13/23 1812             Note:  This document was prepared using Dragon voice recognition software and may include unintentional dictation  errors.   Trinna Post, MD 03/13/23 [phone]

## 2023-04-01 ENCOUNTER — Encounter: Payer: Self-pay | Admitting: Gastroenterology

## 2023-04-12 ENCOUNTER — Encounter: Admission: RE | Payer: Self-pay | Source: Home / Self Care

## 2023-04-12 ENCOUNTER — Encounter: Payer: Self-pay | Admitting: Gastroenterology

## 2023-04-12 ENCOUNTER — Encounter: Payer: Self-pay | Admitting: Anesthesiology

## 2023-04-12 ENCOUNTER — Ambulatory Visit
Admission: RE | Admit: 2023-04-12 | Payer: Managed Care, Other (non HMO) | Source: Home / Self Care | Admitting: Gastroenterology

## 2023-04-12 HISTORY — DX: Adjustment disorder with depressed mood: F43.21

## 2023-04-12 HISTORY — DX: Depression, unspecified: F32.A

## 2023-04-12 HISTORY — DX: Essential (primary) hypertension: I10

## 2023-04-12 HISTORY — DX: Plantar fascial fibromatosis: M72.2

## 2023-04-12 HISTORY — DX: Psychophysiologic insomnia: F51.04

## 2023-04-12 HISTORY — DX: Personal history of other infectious and parasitic diseases: Z86.19

## 2023-04-12 HISTORY — DX: Personal history of urinary calculi: Z87.442

## 2023-04-12 SURGERY — COLONOSCOPY WITH PROPOFOL
Anesthesia: General
